# Patient Record
Sex: Male | Born: 1955
Health system: Southern US, Community
[De-identification: ages and names within clinical notes are randomized; demographics above are authoritative.]

## PROBLEM LIST (undated history)

## (undated) DIAGNOSIS — G4733 Obstructive sleep apnea (adult) (pediatric): Secondary | ICD-10-CM

## (undated) DIAGNOSIS — F419 Anxiety disorder, unspecified: Secondary | ICD-10-CM

## (undated) DIAGNOSIS — F32A Depression, unspecified: Secondary | ICD-10-CM

## (undated) DIAGNOSIS — I499 Cardiac arrhythmia, unspecified: Secondary | ICD-10-CM

## (undated) DIAGNOSIS — I10 Essential (primary) hypertension: Secondary | ICD-10-CM

## (undated) HISTORY — DX: Obstructive sleep apnea (adult) (pediatric): G47.33

## (undated) HISTORY — PX: TONSILLECTOMY: SUR1361

## (undated) HISTORY — PX: KNEE CARTILAGE SURGERY: SHX688

## (undated) HISTORY — DX: Anxiety disorder, unspecified: F41.9

## (undated) HISTORY — PX: MOUTH SURGERY: SHX715

## (undated) HISTORY — PX: OTHER SURGICAL HISTORY: SHX169

## (undated) HISTORY — PX: ATRIAL ABLATION SURGERY: SHX560

---

## 2000-01-31 ENCOUNTER — Observation Stay (HOSPITAL_COMMUNITY): Admission: EM | Admit: 2000-01-31 | Discharge: 2000-02-01 | Payer: Self-pay | Admitting: Emergency Medicine

## 2004-09-05 ENCOUNTER — Ambulatory Visit (HOSPITAL_COMMUNITY): Admission: RE | Admit: 2004-09-05 | Discharge: 2004-09-05 | Payer: Self-pay | Admitting: Family Medicine

## 2005-01-18 ENCOUNTER — Emergency Department (HOSPITAL_COMMUNITY): Admission: EM | Admit: 2005-01-18 | Discharge: 2005-01-18 | Payer: Self-pay | Admitting: Emergency Medicine

## 2006-03-23 ENCOUNTER — Ambulatory Visit (HOSPITAL_COMMUNITY): Admission: RE | Admit: 2006-03-23 | Discharge: 2006-03-23 | Payer: Self-pay | Admitting: *Deleted

## 2006-03-23 ENCOUNTER — Encounter (INDEPENDENT_AMBULATORY_CARE_PROVIDER_SITE_OTHER): Payer: Self-pay | Admitting: *Deleted

## 2006-04-13 ENCOUNTER — Ambulatory Visit: Payer: Self-pay | Admitting: Internal Medicine

## 2006-05-18 ENCOUNTER — Inpatient Hospital Stay (HOSPITAL_COMMUNITY): Admission: AD | Admit: 2006-05-18 | Discharge: 2006-05-21 | Payer: Self-pay | Admitting: Cardiology

## 2007-10-15 ENCOUNTER — Ambulatory Visit (HOSPITAL_COMMUNITY): Admission: RE | Admit: 2007-10-15 | Discharge: 2007-10-16 | Payer: Self-pay | Admitting: Orthopedic Surgery

## 2010-11-26 NOTE — Op Note (Signed)
NAMEMATTEO, BANKE NO.:  [id number]   MEDICAL RECORD NO.:  [id number]          PATIENT TYPE:  AMB   LOCATION:  DAY                          FACILITY:  [facility] [place]   PHYSICIAN:  Georges Lynch. Gioffre, M.D.DATE OF BIRTH:  July 22, 1955   DATE OF PROCEDURE:  10/15/2007  DATE OF DISCHARGE:                               OPERATIVE REPORT   SURGEON:  Dr. Darrelyn Hillock.   ASSISTANT:  Jamelle Rushing, P.A.   PREOPERATIVE DIAGNOSES:  1. Complete tear and retraction of the rotator cuff tendon, right      shoulder.  2. Severe impingement syndrome, right shoulder.   POSTOPERATIVE DIAGNOSES:  1. Complete tear and retraction of the rotator cuff tendon, right      shoulder.  2. Severe impingement syndrome, right shoulder.   OPERATION:  1. Open partial acromionectomy and acromioplasty right shoulder.  2. Repair of a complete tear of the central portion of the rotator      cuff tendon, right shoulder, utilizing a Peak anchor with four      sutures.  3. Restore tendon graft, right shoulder.   PROCEDURE:  Under general anesthesia, routine orthopedic prep and  draping of the upper extremity was carried out.  He had 2 grams of IV  Ancef preop.  Note prior to surgery, he had an interscalene nerve block  in the holding area.  When he was taken back to surgery, we did a  sterile prep and draping of the right shoulder and then following that  an incision was made over the anterior aspect of the right shoulder,  bleeders identified and cauterized.  Following that, I separated the  deltoid tendon by sharp dissection from the acromion.  At that time, I  split the very small proximal part of the deltoid muscle.  I excised the  subdeltoid bursa.  Immediately upon going down in the shoulder fluid  came out of the wound site.  This was definitely indicative of a rotator  cuff tear.  We then protected the acromion.  He had a large beak-shaped  acromion which literally was imbedding down into the tendon  and caused a  large hole in the tendon.  We protected the cuff with the Bennett  retractor, utilized the oscillating saw, did a partial acromionectomy  and acromioplasty utilizing the bur.  Following that, we thoroughly  irrigated out the shoulder and then identified a large central tear of  the rotator cuff. We then burred the lateral articular surface of the  humeral head.  We then brought the cuff down in place and sutured it  with a peak anchor in the proximal humerus and four sutures were  attached to the anchor. We brought those out those out through the  tendon and sutured it down in place.  Once this was done, we then  utilized Restore tendon graft.  We had a nice repair.  We also  reestablished  the subacromial space with the acromionectomy. We thoroughly irrigated  out the area and closed the wound in layers in the usual fashion.  The  skin was closed  with metal staples and a sterile Neosporin dressing was  applied.  He was then placed in a shoulder immobilizer.           ______________________________  Georges Lynch Darrelyn Hillock, M.D.     RAG/MEDQ  D:  10/15/2007  T:  10/15/2007  Job:  [id number]

## 2010-11-29 NOTE — H&P (Signed)
NAMEKENLY, XIAO NO.:  [id number]   MEDICAL RECORD NO.:  [id number]          PATIENT TYPE:  INP   LOCATION:  6525                         FACILITY:  [facility]   PHYSICIAN:  Georga Hacking, M.D.DATE OF BIRTH:  Feb 06, 1956   DATE OF ADMISSION:  05/18/2006  DATE OF DISCHARGE:                              HISTORY & PHYSICAL   HISTORY:  The patient is a 55 year old male admitted to initiate  antiarrhythmic therapy for atrial fibrillation.  The patient has a  previous history of paroxysmal atrial fibrillation which was first  diagnosed in 1999.  He has been intermittently on Coumadin throughout  the years.  He has had transient Emergency Room visits and has been  maintained on flecainide and Coumadin.  He for the most part has been  relatively asymptomatic and about a year ago had a normal stress  Cardiolite study because of the history of chest pain.  He develops  significant atrial fibrillation and atrial flutter and underwent a TEE  guided cardioversion in September.  He tolerated this fairly well, and  when he was seen back in the office March 26, 2006, was in sinus  rhythm.  He has continued to have some episodes of atrial fibrillation  since that time and feels as if they have been more frequent.  He was  also seen by Dr. Lewayne Bunting when he sought a second opinion consult  who thought that alternatively, he could stop flecainide and go on a  medicine such as Tikosyn for suppression.  The patient has also seen Dr.  Clydie Braun at [facility] and would be tentatively scheduled  for an ablation in January if he continues to have significant symptoms.  He is admitted at this time for Tikosyn loading.   PAST MEDICAL HISTORY:  Remarkable for a history of asthma, obesity, some  history of depression, and reflux.   PREVIOUS SURGERY:  Tonsillectomy, vasectomy, and nasal septoplasty.   ALLERGIES:  AMOXICILLIN.   CURRENT MEDICATIONS:  1. He is on  Cymbalta 60 mg daily.  2. Atenolol 25 mg daily.  3. Aspirin 325 mg daily.  4. He stopped flecainide on Friday.   SOCIAL HISTORY:  He is married and lives with his wife and children.  He  is a nonsmoker.  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   FAMILY HISTORY:  Father is alive and well with angina and a pacemaker.  Mother is alive and well with a history of breast cancer.   REVIEW OF SYSTEMS:  He has been obese over the years and does complain  of some malaise and fatigue.  He has a history of constipation and some  dyspepsia.  He has occasional dizziness and has some situational stress  as well as some depression.  Other than as noted above, the remainder of  the review of systems is unremarkable.   PHYSICAL EXAMINATION:  GENERAL:  He is a pleasant male who is currently  in no acute distress.  VITAL SIGNS:  His blood pressure was 110/70.  Pulse was 70 and regular.  SKIN:  Warm and dry.  ENT:  EOMI.  PERRLA.  External auditory canals clear.  Fundi not  examined.  Pharynx negative.  NECK:  Supple without masses, JVD, thyromegaly, or bruits.  LUNGS:  Clear to A&P.  CARDIOVASCULAR:  Normal S1, S2.  No S3 or murmur.  ABDOMEN:  Soft and nontender.  Femoral distal pulses 2+.   IMPRESSION:  1. Recurrent paroxysmal atrial fibrillation with recent      transesophageal echocardiogram guided cardioversion.  2. History of situational depression.  3. Obesity.   RECOMMENDATIONS:  The patient had an echocardiogram done recently that  was unremarkable.  He will be admitted to the hospital at this time for  Tikosyn loading.      Georga Hacking, M.D.  Electronically Signed     WST/MEDQ  D:  05/18/2006  T:  05/18/2006  Job:  [id number]   cc:   Dario Guardian, M.D.

## 2010-11-29 NOTE — H&P (Signed)
[name], [name] NO.:  [id number]   MEDICAL RECORD NO.:  [id number]           PATIENT TYPE:   LOCATION:                                 FACILITY:   PHYSICIAN:  Elmore Guise., M.D.DATE OF BIRTH:  [birth year]   DATE OF ADMISSION:  03/23/2006  DATE OF DISCHARGE:                                HISTORY & PHYSICAL   INDICATION FOR ADMISSION:  TEE cardioversion with recurrent atrial  fibrillation.   HISTORY OF PRESENT ILLNESS:  The patient is a very pleasant 55 year old  white male, past medical history of paroxysmal atrial fibrillation, obesity,  who presents for evaluation of palpitations and increasing shortness of  breath.  Patient reports normal state of health and actually was just seen  by Dr. Tresa Endo on last Tuesday over the last five days.  He has been having  increasing palpitations, decreased exertional tolerance, as well as  increasing shortness of breath.  He states I feel like my heart is out of  rhythm.  He denies any over the counter medications, no significant alcohol  intake and no chest pain.  He has had no fever, cough, orthopnea, PND, pre-  syncope or syncope.  He continues to work full time.   REVIEW OF SYSTEMS:  As per HPI.  All others are negative.   CURRENT MEDICATIONS:  1. Tambocor 200 mg twice daily (max dose).'  2. Atenolol 25 daily.  3. Cymbalta 60 mg daily.  4. Aspirin 325 mg daily.  5. Vitamin C, E, B-Complex and multivitamin all once daily.   ALLERGIES:  Amoxicillin.   FAMILY HISTORY:  Positive for heart disease and pacemaker implant with his  father.  His mother had syncope, hypertension and questionable stroke.   SOCIAL HISTORY:  He is married, works as a Tree surgeon, exercises up to  five times per week, no tobacco, drinks socially with one to two glasses of  wine, either daily or every other day.  Also drinks between one and two  caffeinated beverages daily.   PAST SURGICAL HISTORY:  Tonsillectomy and  septoplasty.   PAST HOSPITALIZATION:  Pneumonia three years ago and a-fib with RVR  approximately year ago.   PHYSICAL EXAMINATION:  VITAL SIGNS:  His weight is 232 pounds.  His blood  pressure is 142/80.  His heart rate is 76 and irregular.  GENERAL:  He is a very pleasant white male, alert and oriented x4, no acute  distress.  He has no JVD and no bruits.  LUNGS:  Clear.  HEART:  Irregularly irregular with a normal S1, S2.  ABDOMEN:  Soft, nontender, nondistended.  No rebound or guarding.  EXTREMITIES:  Warm with 2+ pulses and no significant edema.  CARDIAC:  From a cardiac standpoint, he has had a normal echo and Cardiolite  with echo being in 1999 and Cardiolite in 2006.  He has been on Tambocor for  the last 7 years.  His EKG today shows atrial flutter, rate of 100 times per  minute, QT of 378 with poor R-wave progressions, only change from his prior  tracing dated  March 10, 2006, as atrial flutter seems to be new.  His blood  work done today shows a PT-INR of 11.7, 0.87 with a PTT of 31.4.  He had a  CBC showing a white count of 7.8, hemoglobin of 16.6, platelet count of 316,  BUN and creatinine of 15 and 1.2, potassium level 4.4.   IMPRESSION:  1. History of paroxysmal atrial fibrillation, now with recurrence on full      dose Tambocor.  2. No evidence of structural heart disease by past evaluation.   PLAN:  At this time, I have asked him to increase his atenolol to 25 mg  twice daily.  I discussed risks, benefits of DC Cardioversion.  This will be  scheduled for him within the next 24 to 72 hours.  Should his symptoms  resolve, I have asked him to give [place] a call so we can get an EKG to see if  he would spontaneously convert; however, if he does not, since they have  been going on for over a week, we both feel Cardioversion would likely be  beneficial for him.  I also discussed potentially allowing him to see an EP  doctor for possible ablation; however, we both feel that  this would be  suited more if he has recurrence in him, and Dr. Donnie Aho will speak further  on this subject.      Elmore Guise., M.D.  Electronically Signed     TWK/MEDQ  D:  03/19/2006  T:  03/19/2006  Job:  [id number]   cc:   Georga Hacking, M.D.

## 2010-11-29 NOTE — Assessment & Plan Note (Signed)
[facility]                           ELECTROPHYSIOLOGY OFFICE NOTE   [name], [name]                        MRN:          [id number]  DATE:04/13/2006                            DOB:          1956/04/06    ELECTROPHYSIOLOGY CONSULTATION NOTE.   [name] is self-referred today, seeking a second opinion regarding  possible afib ablation.   HISTORY OF PRESENT ILLNESS:  Patient is a very pleasant 55 year old man who  has a history of palpitations and documented afib since 2000.  He has had a  history of obesity and depression and asthma in the past.  He notes that his  atrial arrhythmias increased in frequency and severity really beginning in  the last 6-12 months.  Prior to that, he had been initially placed on  flecainide but over the last several months had been given increasingly  large doses of flecainide, to maintain sinus rhythm, under the direction of  Dr. Viann Fish.  He states that in the last 6 months he has had 2  prolonged episodes which have lasted for over a day.  The most recent one  lasted for over a week before he finally sought medical attention and  underwent transesophageal guided DC cardioversion.  The patient states that  when he is in afib he becomes short of breath, somewhat diaphoretic and will  actually have discomfort in his jaw.  He really does not have much in the  way of chest pain and he has never had syncope.  He states that when he is  out of rhythm he feels okay at rest but when he tries to do anything  strenuous he develops symptoms as previously noted.   MEDICATIONS:  1. Atenolol 25 mg daily.  2. Flecainide 200 mg twice a day.  3. He is on low-dose aspirin.  4. He is not on Coumadin.   HE HAS NO KNOWN DRUG ALLERGIES.   FAMILY HISTORY:  Notable for both parents being alive and otherwise in good  health, though his father does have a pacemaker and some anginal symptoms.   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  He denies tobacco use, he drinks 2 alcoholic beverages per  week.   REVIEW OF SYSTEMS:  Is as noted in the  HPI.  There is a remote history of  asthma.  He has a history of constipation and gastroesophageal reflux.  There is a history of anxiety and depression.  The rest of review of systems  was reviewed and found to be negative.   PHYSICAL EXAMINATION:  He is a pleasant middle-aged man in no acute  distress.  The blood pressure today was 126/78, the pulse was 61 and  regular, the respirations were 18, the weight was 233 pounds.  HEENT EXAM:  Normocephalic and atraumatic.  Pupils equal and round.  The  oropharynx is moist.  Sclera were anicteric.  NECK:  Revealed no jugular venous distention.  There is no thyromegaly.  The  trachea is midline.  The carotids are  2+ and symmetric.  LUNGS:  Clear bilaterally to auscultation.  No wheezes, rales or rhonchi.  There is no increased worker breathing.  CARDIOVASCULAR EXAM:  Revealed a regular rate and rhythm with normal S1 and  S2.  The heart sounds were somewhat distant.  The PMI was not enlarged nor  was it laterally displaced.  I did not appreciate an S3 or an S4 gallop.  ABDOMINAL EXAM:  Soft and nontender.  There was no organomegaly.  The bowel  sounds were present.  There was no rebound or guarding.  EXTREMITIES:  Demonstrated no cyanosis, clubbing or edema.  The pulses were  2+ and symmetric.  NEUROLOGIC EXAM:  Alert and oriented x3 with cranial nerves intact.  The  strength was 5/5 and symmetric.  The EKG demonstrates normal sinus rhythm,  first degree AV block, a QRS duration of 130 milliseconds and left axis  deviation.   IMPRESSION:  1. Paroxysmal atrial fibrillation.  2. Chronic flecainide therapy.  3. Obesity.   DISCUSSION:  I discussed treatment options with [name] in some detail.  These include referral for consideration for afib ablation vs.  continuing on  flecainide vs. trying inpatient admission for Tikosyn initiation.  I have  discussed the benefits and problems with each of these options in some  detail with the patient.  Ultimately, I have recommended for now continuing  on flecainide is a reasonable option.  If his symptoms of fibrillation  increase in frequency or severity then discontinuing flecainide and coming  in for Tikosyn loading would be very reasonable.  Because of the patient's  young age, he may ultimately be a candidate for afib ablation, but at this  point I think a period of watchful waiting would not be unreasonable.  However, he does note that he has had an appointment made with Dr. Clydie Braun, at [facility], and, while he may well not prefer to  proceed with ablation at this point, seeing Dr. Sampson Goon initially for a  consultation would be a very reasonable thing.            ______________________________  Doylene Canning. Ladona Ridgel, MD     GWT/MedQ  DD:  04/13/2006  DT:  04/14/2006  Job #:  [id number]   cc:   Georga Hacking, M.D.  Dario Guardian, M.D.

## 2010-11-29 NOTE — Consult Note (Signed)
[facility]  Patient:    [name], [name]                          MRN: [id number] Adm. Date:  [id number] Disc. Date: [id number] Attending:  Meade Maw A CC:         Dr. Pecola Leisure., M.D.                          Consultation Report  REFERRING PHYSICIAN:  Dr. Chase Picket.  REASON FOR CONSULTATION:  Atrial fibrillation, brought under control.  HISTORY:  The patient is a 55 year old gentleman, otherwise healthy, who presented to the emergency room tonight with concerns of rapid heart rate. The patient has been diagnosed with atrial fibrillation for two years and has been of Tambocor.  He has had one episode of atrial fibrillation which was discovered inadvertently on a routine office visit.  The patient has been vacationing and has had increased alcohol use.  He has subsequent shortness of breath.  He has been treated with Ceftin for approximately two days for a presumed upper respiratory tract problem.  Dr. Lattie Corns saw the patient while in the emergency room and felt the fever to be secondary to viral type syndrome.  The patient has had a nonproductive cough and fevers to 100.5.  He has also had some increased diaphoresis.  REVIEW OF SYSTEMS:  He has had some shortness of breath.  No chest pain. Fever as noted above.  No pedal edema.  Nonproductive cough.  Otherwise, rule out shad been negative.  PAST MEDICAL HISTORY: 1. Hypertension. 2. Atrial fibrillation.  CURRENT MEDICATIONS:  Tambocor.  Atenolol 25 q.d.  DRUG ALLERGIES:  AMOXICILLIN.  SOCIAL HISTORY:  The patient is married.  As noted above, has been recently vacationing with increased alcohol use.  He stopped alcohol approximately two day prior to presentation.  PHYSICAL EXAMINATION:  GENERAL:  Middle-aged male in no acute distress.  HEENT:  Unremarkable.  He is diaphoretic.  PULMONARY:  Breath sounds equal.  Clear to auscultation.  The patient  had previously received Lasix.  VITAL SIGNS:  121/73, heart rate 90-105, regular rhythm; temperature 100.6 in the ER.  ABNORMAL:  Soft, benign and nontender.  No epigastric tenderness.  EXTREMITIES:  No peripheral edema.  NEUROLOGIC:  Nonfocal.  Motor is 5/5 throughout.  SKIN:  Warm.  LABORATORY DATA:  White count 9.0, hematocrit 45.9, platelet count 208, INR 1.0.  Troponin I is 0.05.  CK is 181.  Electrolytes are within normal limits.  IMPRESSION:  Atrial fibrillation.  This may be related to recent alcohol use or to fever.  The patient has had one further episode of breakthrough atrial fibrillation while on Tambocor.  Options were discussed with the patient.  The patient will be discharged to home on an increased dose of atenolol.  Tambocor will be discontinued.  The patient will be restarted on his previous dose of Coumadin at 5 mg.  His INR will be rechecked on Monday.  Following three weeks of anticoagulation, consideration to chemical cardioversion will be performed. Further management will be per Dr. Donnie Aho. DD:  02/01/00 TD:  02/03/00 Job: [id number]

## 2010-11-29 NOTE — Discharge Summary (Signed)
NAMEEZEKIEL, MENZER NO.:  [id number]   MEDICAL RECORD NO.:  [id number]          PATIENT TYPE:  INP   LOCATION:  6525                         FACILITY:  [facility]   PHYSICIAN:  Georga Hacking, M.D.DATE OF BIRTH:  August 01, 1955   DATE OF ADMISSION:  05/18/2006  DATE OF DISCHARGE:  05/21/2006                               DISCHARGE SUMMARY   FINAL DIAGNOSES:  1. Atrial fibrillation, paroxysmal.  2. History of situational stress and depression.   HISTORY OF PRESENT ILLNESS:  A 55 year old male admitted to initiate  antiarrhythmic therapy for atrial fibrillation.  He has previous  paroxysmal atrial fibrillation diagnosed since 1999.  He has been  maintained on flecainide and Coumadin, but noted breakthrough atrial  fibrillation increasing in frequency and severity despite this.  He  underwent at TEE guided cardioversion in September.  Echocardiogram at  that time was unremarkable.  He has since had a consultation with Dr.  Lewayne Bunting as well as Dr. Clydie Braun at [facility] and is  considering having catheter ablation.  Because of the frequency of  breakthrough atrial fibrillation on flecainide, he is being admitted to  change him to Tikosyn to see if this would suppress the atrial  fibrillation while he is considering whether or not he wants to have a  radiofrequency ablation for atrial fibrillation.  Please see the  previously dictated history and physical for remainder of the details.   HOSPITAL COURSE:  His laboratory work was normal with a normal CBC,  except for a glucose of 115, BUN, creatinine were normal.  EKG showed  sinus rhythm.  He was initiated on Tikosyn at a dosage of 500 mcg twice  daily.  He tolerated this well, but continued to have some atrial  fibrillation in the morning.  His QTC was monitored and prolonged only  slightly and was felt within the acceptable parameters.  He was given  the [employer] education package and he is discharged  at this time in  improved condition.  He was in atrial fibrillation the morning of  discharge.   DISCHARGE MEDICATIONS:  1. Tikosyn 500 mcg b.i.d.  2. Atenolol 25 mg b.i.d. with a dose taken at bedtime and in the      morning.  3. Aspirin 325 mg daily.  4. Cymbalta 60 mg daily.   FOLLOW UP:  He is to follow up in 1 week and is to call if there are  problems.      Georga Hacking, M.D.  Electronically Signed     WST/MEDQ  D:  05/21/2006  T:  05/21/2006  Job:  [id number]   cc:   Dario Guardian, M.D.

## 2011-02-12 DIAGNOSIS — S93629A Sprain of tarsometatarsal ligament of unspecified foot, initial encounter: Secondary | ICD-10-CM

## 2011-02-12 DIAGNOSIS — F419 Anxiety disorder, unspecified: Secondary | ICD-10-CM | POA: Insufficient documentation

## 2011-04-08 LAB — DIFFERENTIAL
Basophils Absolute: 0.1
Basophils Relative: 1
Eosinophils Absolute: 0.1
Eosinophils Relative: 2
Lymphocytes Relative: 27
Lymphs Abs: 1.4
Monocytes Absolute: 0.4
Monocytes Relative: 8
Neutro Abs: 3.2
Neutrophils Relative %: 62

## 2011-04-08 LAB — COMPREHENSIVE METABOLIC PANEL
ALT: 21
AST: 22
Albumin: 3.9
Alkaline Phosphatase: 57
BUN: 19
CO2: 29
Calcium: 9
Chloride: 103
Creatinine, Ser: 0.99
GFR calc Af Amer: >60
GFR calc non Af Amer: >60
Glucose, Bld: 100 — ABNORMAL HIGH
Potassium: 3.9
Sodium: 139
Total Bilirubin: 0.7
Total Protein: 6.4

## 2011-04-08 LAB — URINALYSIS, ROUTINE W REFLEX MICROSCOPIC
Glucose, UA: NEGATIVE
Protein, ur: NEGATIVE
Specific Gravity, Urine: 1.023

## 2011-04-08 LAB — CBC
HCT: 43.2
Hemoglobin: 15.1
MCHC: 34.9
MCV: 89
Platelets: 250
RBC: 4.85
RDW: 12.5
WBC: 5.1

## 2011-04-08 LAB — PROTIME-INR
INR: 0.9
Prothrombin Time: 12

## 2011-04-08 LAB — TYPE AND SCREEN: Antibody Screen: NEGATIVE

## 2016-07-16 DIAGNOSIS — L723 Sebaceous cyst: Secondary | ICD-10-CM | POA: Diagnosis not present

## 2016-07-16 DIAGNOSIS — L821 Other seborrheic keratosis: Secondary | ICD-10-CM | POA: Diagnosis not present

## 2016-07-16 DIAGNOSIS — D225 Melanocytic nevi of trunk: Secondary | ICD-10-CM | POA: Diagnosis not present

## 2016-07-16 DIAGNOSIS — L570 Actinic keratosis: Secondary | ICD-10-CM | POA: Diagnosis not present

## 2016-07-18 DIAGNOSIS — M542 Cervicalgia: Secondary | ICD-10-CM | POA: Diagnosis not present

## 2016-07-18 DIAGNOSIS — M25511 Pain in right shoulder: Secondary | ICD-10-CM | POA: Diagnosis not present

## 2016-07-21 DIAGNOSIS — M25511 Pain in right shoulder: Secondary | ICD-10-CM | POA: Diagnosis not present

## 2016-07-21 DIAGNOSIS — M542 Cervicalgia: Secondary | ICD-10-CM | POA: Diagnosis not present

## 2016-07-22 DIAGNOSIS — Z0000 Encounter for general adult medical examination without abnormal findings: Secondary | ICD-10-CM | POA: Diagnosis not present

## 2016-07-22 DIAGNOSIS — I10 Essential (primary) hypertension: Secondary | ICD-10-CM | POA: Diagnosis not present

## 2016-07-22 DIAGNOSIS — Z125 Encounter for screening for malignant neoplasm of prostate: Secondary | ICD-10-CM | POA: Diagnosis not present

## 2016-07-22 DIAGNOSIS — E7800 Pure hypercholesterolemia, unspecified: Secondary | ICD-10-CM | POA: Diagnosis not present

## 2016-07-24 DIAGNOSIS — M542 Cervicalgia: Secondary | ICD-10-CM | POA: Diagnosis not present

## 2016-07-24 DIAGNOSIS — M25511 Pain in right shoulder: Secondary | ICD-10-CM | POA: Diagnosis not present

## 2016-07-29 DIAGNOSIS — M542 Cervicalgia: Secondary | ICD-10-CM | POA: Diagnosis not present

## 2016-07-29 DIAGNOSIS — M25511 Pain in right shoulder: Secondary | ICD-10-CM | POA: Diagnosis not present

## 2016-10-21 DIAGNOSIS — J0140 Acute pansinusitis, unspecified: Secondary | ICD-10-CM | POA: Diagnosis not present

## 2017-01-19 DIAGNOSIS — I10 Essential (primary) hypertension: Secondary | ICD-10-CM | POA: Diagnosis not present

## 2017-03-10 DIAGNOSIS — J0100 Acute maxillary sinusitis, unspecified: Secondary | ICD-10-CM | POA: Diagnosis not present

## 2017-03-23 DIAGNOSIS — Z23 Encounter for immunization: Secondary | ICD-10-CM | POA: Diagnosis not present

## 2017-07-21 DIAGNOSIS — D225 Melanocytic nevi of trunk: Secondary | ICD-10-CM | POA: Diagnosis not present

## 2017-07-21 DIAGNOSIS — L720 Epidermal cyst: Secondary | ICD-10-CM | POA: Diagnosis not present

## 2017-07-21 DIAGNOSIS — L821 Other seborrheic keratosis: Secondary | ICD-10-CM | POA: Diagnosis not present

## 2017-07-21 DIAGNOSIS — L570 Actinic keratosis: Secondary | ICD-10-CM | POA: Diagnosis not present

## 2017-08-11 DIAGNOSIS — I10 Essential (primary) hypertension: Secondary | ICD-10-CM | POA: Diagnosis not present

## 2017-08-11 DIAGNOSIS — E7800 Pure hypercholesterolemia, unspecified: Secondary | ICD-10-CM | POA: Diagnosis not present

## 2017-08-11 DIAGNOSIS — Z125 Encounter for screening for malignant neoplasm of prostate: Secondary | ICD-10-CM | POA: Diagnosis not present

## 2017-08-11 DIAGNOSIS — Z0000 Encounter for general adult medical examination without abnormal findings: Secondary | ICD-10-CM | POA: Diagnosis not present

## 2017-12-02 DIAGNOSIS — J209 Acute bronchitis, unspecified: Secondary | ICD-10-CM | POA: Diagnosis not present

## 2017-12-03 DIAGNOSIS — M5489 Other dorsalgia: Secondary | ICD-10-CM | POA: Diagnosis not present

## 2017-12-03 DIAGNOSIS — N50811 Right testicular pain: Secondary | ICD-10-CM | POA: Diagnosis not present

## 2017-12-03 DIAGNOSIS — R1031 Right lower quadrant pain: Secondary | ICD-10-CM | POA: Diagnosis not present

## 2017-12-04 ENCOUNTER — Other Ambulatory Visit: Payer: Self-pay | Admitting: Family Medicine

## 2017-12-04 ENCOUNTER — Ambulatory Visit
Admission: RE | Admit: 2017-12-04 | Discharge: 2017-12-04 | Disposition: A | Discharge status: Home / Self Care | Payer: 59 | Source: Ambulatory Visit | Attending: Family Medicine | Admitting: Family Medicine

## 2017-12-04 DIAGNOSIS — R1031 Right lower quadrant pain: Secondary | ICD-10-CM

## 2017-12-04 DIAGNOSIS — N132 Hydronephrosis with renal and ureteral calculous obstruction: Secondary | ICD-10-CM | POA: Diagnosis not present

## 2017-12-10 DIAGNOSIS — N201 Calculus of ureter: Secondary | ICD-10-CM | POA: Diagnosis not present

## 2017-12-14 ENCOUNTER — Other Ambulatory Visit: Payer: Self-pay | Admitting: Urology

## 2017-12-15 ENCOUNTER — Encounter (HOSPITAL_COMMUNITY)
Admission: RE | Admit: 2017-12-15 | Discharge: 2017-12-15 | Disposition: A | Discharge status: Home / Self Care | Payer: 59 | Source: Ambulatory Visit | Attending: Urology | Admitting: Urology

## 2017-12-15 ENCOUNTER — Encounter (HOSPITAL_COMMUNITY): Payer: Self-pay | Admitting: *Deleted

## 2017-12-15 DIAGNOSIS — I10 Essential (primary) hypertension: Secondary | ICD-10-CM | POA: Diagnosis not present

## 2017-12-15 DIAGNOSIS — N201 Calculus of ureter: Secondary | ICD-10-CM | POA: Diagnosis present

## 2017-12-17 ENCOUNTER — Ambulatory Visit (HOSPITAL_COMMUNITY)
Admission: RE | Admit: 2017-12-17 | Discharge: 2017-12-17 | Disposition: A | Discharge status: Home / Self Care | Payer: 59 | Source: Ambulatory Visit | Attending: Urology | Admitting: Urology

## 2017-12-17 ENCOUNTER — Ambulatory Visit (HOSPITAL_COMMUNITY): Payer: 59

## 2017-12-17 ENCOUNTER — Other Ambulatory Visit: Payer: Self-pay

## 2017-12-17 ENCOUNTER — Encounter (HOSPITAL_COMMUNITY)
Admission: RE | Disposition: A | Discharge status: Home / Self Care | Payer: Self-pay | Source: Ambulatory Visit | Attending: Urology

## 2017-12-17 ENCOUNTER — Encounter (HOSPITAL_COMMUNITY): Payer: Self-pay | Admitting: *Deleted

## 2017-12-17 DIAGNOSIS — N201 Calculus of ureter: Secondary | ICD-10-CM | POA: Diagnosis not present

## 2017-12-17 DIAGNOSIS — I10 Essential (primary) hypertension: Secondary | ICD-10-CM | POA: Insufficient documentation

## 2017-12-17 HISTORY — PX: EXTRACORPOREAL SHOCK WAVE LITHOTRIPSY: SHX1557

## 2017-12-17 HISTORY — DX: Cardiac arrhythmia, unspecified: I49.9

## 2017-12-17 HISTORY — DX: Essential (primary) hypertension: I10

## 2017-12-17 SURGERY — LITHOTRIPSY, ESWL
Anesthesia: LOCAL | Laterality: Right

## 2017-12-17 MED ORDER — DIAZEPAM 5 MG PO TABS
10.0000 mg | ORAL_TABLET | ORAL | Status: AC
  Administered 2017-12-17: 10 mg via ORAL
  Filled 2017-12-17: qty 2

## 2017-12-17 MED ORDER — DIPHENHYDRAMINE HCL 25 MG PO CAPS
25.0000 mg | ORAL_CAPSULE | ORAL | Status: AC
  Administered 2017-12-17: 25 mg via ORAL
  Filled 2017-12-17: qty 1

## 2017-12-17 MED ORDER — SODIUM CHLORIDE 0.9 % IV SOLN
INTRAVENOUS | Status: DC
  Administered 2017-12-17: 07:00:00 via INTRAVENOUS

## 2017-12-17 MED ORDER — LEVOFLOXACIN 500 MG PO TABS
500.0000 mg | ORAL_TABLET | ORAL | Status: AC
  Administered 2017-12-17: 500 mg via ORAL
  Filled 2017-12-17: qty 1

## 2017-12-17 NOTE — Discharge Instructions (Signed)
Lithotripsy, Care After °This sheet gives you information about how to care for yourself after your procedure. Your health care provider may also give you more specific instructions. If you have problems or questions, contact your health care provider. °What can I expect after the procedure? °After the procedure, it is common to have: °· Some blood in your urine. This should only last for a few days. °· Soreness in your back, sides, or upper abdomen for a few days. °· Blotches or bruises on your back where the pressure wave entered the skin. °· Pain, discomfort, or nausea when pieces (fragments) of the kidney stone move through the tube that carries urine from the kidney to the bladder (ureter). Stone fragments may pass soon after the procedure, but they may continue to pass for up to 4-8 weeks. °? If you have severe pain or nausea, contact your health care provider. This may be caused by a large stone that was not broken up, and this may mean that you need more treatment. °· Some pain or discomfort during urination. °· Some pain or discomfort in the lower abdomen or (in men) at the base of the penis. ° °Follow these instructions at home: °Medicines °· Take over-the-counter and prescription medicines only as told by your health care provider. °· If you were prescribed an antibiotic medicine, take it as told by your health care provider. Do not stop taking the antibiotic even if you start to feel better. °· Do not drive for 24 hours if you were given a medicine to help you relax (sedative). °· Do not drive or use heavy machinery while taking prescription pain medicine. °Eating and drinking °· Drink enough water and fluids to keep your urine clear or pale yellow. This helps any remaining pieces of the stone to pass. It can also help prevent new stones from forming. °· Eat plenty of fresh fruits and vegetables. °· Follow instructions from your health care provider about eating and drinking restrictions. You may be  instructed: °? To reduce how much salt (sodium) you eat or drink. Check ingredients and nutrition facts on packaged foods and beverages. °? To reduce how much meat you eat. °· Eat the recommended amount of calcium for your age and gender. Ask your health care provider how much calcium you should have. °General instructions °· Get plenty of rest. °· Most people can resume normal activities 1-2 days after the procedure. Ask your health care provider what activities are safe for you. °· If directed, strain all urine through the strainer that was provided by your health care provider. °? Keep all fragments for your health care provider to see. Any stones that are found may be sent to a medical lab for examination. The stone may be as small as a grain of salt. °· Keep all follow-up visits as told by your health care provider. This is important. °Contact a health care provider if: °· You have pain that is severe or does not get better with medicine. °· You have nausea that is severe or does not go away. °· You have blood in your urine longer than your health care provider told you to expect. °· You have more blood in your urine. °· You have pain during urination that does not go away. °· You urinate more frequently than usual and this does not go away. °· You develop a rash or any other possible signs of an allergic reaction. °Get help right away if: °· You have severe pain in   your back, sides, or upper abdomen. °· You have severe pain while urinating. °· Your urine is very dark red. °· You have blood in your stool (feces). °· You cannot pass any urine at all. °· You feel a strong urge to urinate after emptying your bladder. °· You have a fever or chills. °· You develop shortness of breath, difficulty breathing, or chest pain. °· You have severe nausea that leads to persistent vomiting. °· You faint. °Summary °· After this procedure, it is common to have some pain, discomfort, or nausea when pieces (fragments) of the  kidney stone move through the tube that carries urine from the kidney to the bladder (ureter). If this pain or nausea is severe, however, you should contact your health care provider. °· Most people can resume normal activities 1-2 days after the procedure. Ask your health care provider what activities are safe for you. °· Drink enough water and fluids to keep your urine clear or pale yellow. This helps any remaining pieces of the stone to pass, and it can help prevent new stones from forming. °· If directed, strain your urine and keep all fragments for your health care provider to see. Fragments or stones may be as small as a grain of salt. °· Get help right away if you have severe pain in your back, sides, or upper abdomen or have severe pain while urinating. °This information is not intended to replace advice given to you by your health care provider. Make sure you discuss any questions you have with your health care provider. °Document Released: 07/20/2007 Document Revised: 05/21/2016 Document Reviewed: 05/21/2016 °Elsevier Interactive Patient Education © 2018 [employer] °Moderate Conscious Sedation, Adult, Care After °These instructions provide you with information about caring for yourself after your procedure. Your health care provider may also give you more specific instructions. Your treatment has been planned according to current medical practices, but problems sometimes occur. Call your health care provider if you have any problems or questions after your procedure. °What can I expect after the procedure? °After your procedure, it is common: °· To feel sleepy for several hours. °· To feel clumsy and have poor balance for several hours. °· To have poor judgment for several hours. °· To vomit if you eat too soon. ° °Follow these instructions at home: °For at least 24 hours after the procedure: ° °· Do not: °? Participate in activities where you could fall or become injured. °? Drive. °? Use heavy  machinery. °? Drink alcohol. °? Take sleeping pills or medicines that cause drowsiness. °? Make important decisions or sign legal documents. °? Take care of children on your own. °· Rest. °Eating and drinking °· Follow the diet recommended by your health care provider. °· If you vomit: °? Drink water, juice, or soup when you can drink without vomiting. °? Make sure you have little or no nausea before eating solid foods. °General instructions °· Have a responsible adult stay with you until you are awake and alert. °· Take over-the-counter and prescription medicines only as told by your health care provider. °· If you smoke, do not smoke without supervision. °· Keep all follow-up visits as told by your health care provider. This is important. °Contact a health care provider if: °· You keep feeling nauseous or you keep vomiting. °· You feel light-headed. °· You develop a rash. °· You have a fever. °Get help right away if: °· You have trouble breathing. °This information is not intended to replace advice   given to you by your health care provider. Make sure you discuss any questions you have with your health care provider. °Document Released: 04/20/2013 Document Revised: 12/03/2015 Document Reviewed: 10/20/2015 °Elsevier Interactive Patient Education © 2018 [employer]vier Inc[employer] ° °

## 2017-12-18 ENCOUNTER — Encounter (HOSPITAL_COMMUNITY): Payer: Self-pay | Admitting: Urology

## 2017-12-21 DIAGNOSIS — I2510 Atherosclerotic heart disease of native coronary artery without angina pectoris: Secondary | ICD-10-CM | POA: Diagnosis not present

## 2017-12-31 DIAGNOSIS — I480 Paroxysmal atrial fibrillation: Secondary | ICD-10-CM | POA: Diagnosis not present

## 2017-12-31 DIAGNOSIS — I2510 Atherosclerotic heart disease of native coronary artery without angina pectoris: Secondary | ICD-10-CM | POA: Diagnosis not present

## 2017-12-31 DIAGNOSIS — I700 Atherosclerosis of aorta: Secondary | ICD-10-CM | POA: Diagnosis not present

## 2018-01-06 DIAGNOSIS — N201 Calculus of ureter: Secondary | ICD-10-CM | POA: Diagnosis not present

## 2018-03-19 DIAGNOSIS — Z23 Encounter for immunization: Secondary | ICD-10-CM | POA: Diagnosis not present

## 2018-03-19 DIAGNOSIS — I10 Essential (primary) hypertension: Secondary | ICD-10-CM | POA: Diagnosis not present

## 2018-03-19 DIAGNOSIS — E7800 Pure hypercholesterolemia, unspecified: Secondary | ICD-10-CM | POA: Diagnosis not present

## 2018-04-15 DIAGNOSIS — L02414 Cutaneous abscess of left upper limb: Secondary | ICD-10-CM | POA: Diagnosis not present

## 2018-07-21 DIAGNOSIS — L821 Other seborrheic keratosis: Secondary | ICD-10-CM | POA: Diagnosis not present

## 2018-07-21 DIAGNOSIS — D225 Melanocytic nevi of trunk: Secondary | ICD-10-CM | POA: Diagnosis not present

## 2018-07-21 DIAGNOSIS — L570 Actinic keratosis: Secondary | ICD-10-CM | POA: Diagnosis not present

## 2018-07-21 DIAGNOSIS — L720 Epidermal cyst: Secondary | ICD-10-CM | POA: Diagnosis not present

## 2018-08-17 DIAGNOSIS — Z0000 Encounter for general adult medical examination without abnormal findings: Secondary | ICD-10-CM | POA: Diagnosis not present

## 2018-08-17 DIAGNOSIS — E7800 Pure hypercholesterolemia, unspecified: Secondary | ICD-10-CM | POA: Diagnosis not present

## 2018-08-17 DIAGNOSIS — Z23 Encounter for immunization: Secondary | ICD-10-CM | POA: Diagnosis not present

## 2019-06-17 IMAGING — CT CT ABD-PELV W/O CM
1 of 2 series · 14 of 32 positions shown, 19 images · non-contrast
Comparison: Plain films 12/03/2017

CLINICAL DATA: Evaluate for kidney stone. Right-sided pain for 2
weeks. Microscopic hematuria.

EXAM:
CT ABDOMEN AND PELVIS WITHOUT CONTRAST
TECHNIQUE: Multidetector CT imaging of the abdomen and pelvis was performed
following the standard protocol without IV contrast.

[Series 2: renal standard/full · axial · 0.91mm/px · z∈[-495,-5]mm · 14 of 112 slices shown, 19 images]
[im 7/112  soft-tissue]
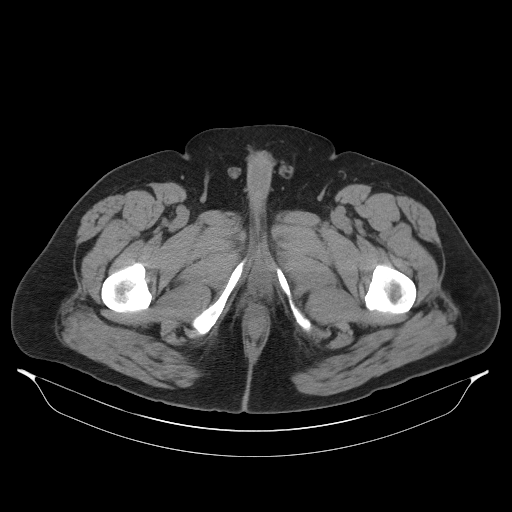
[im 7/112  bone]
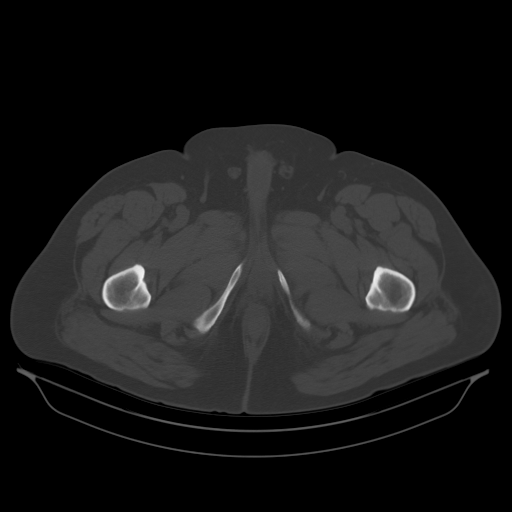
[im 13/112  soft-tissue]
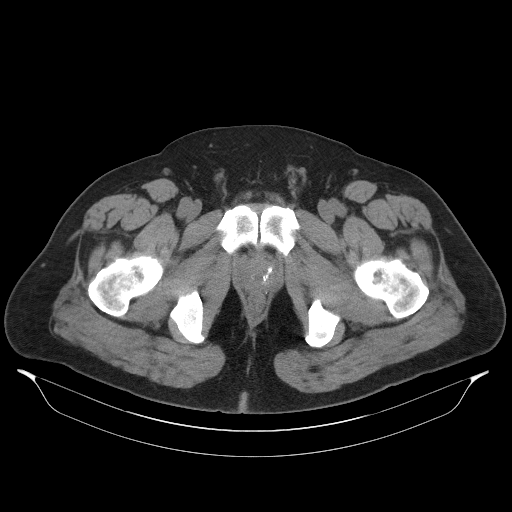
[im 25/112  soft-tissue]
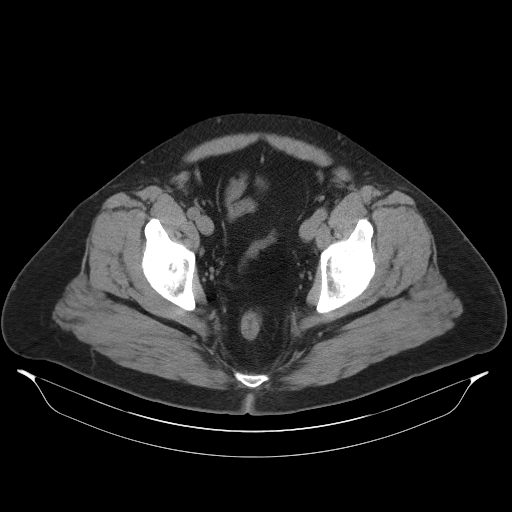
[im 31/112  soft-tissue]
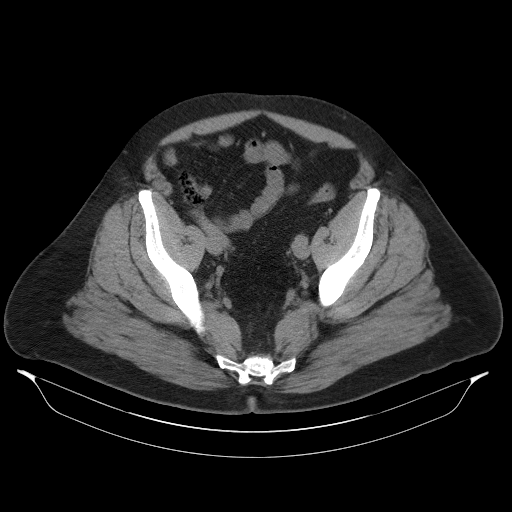
[im 38/112  soft-tissue]
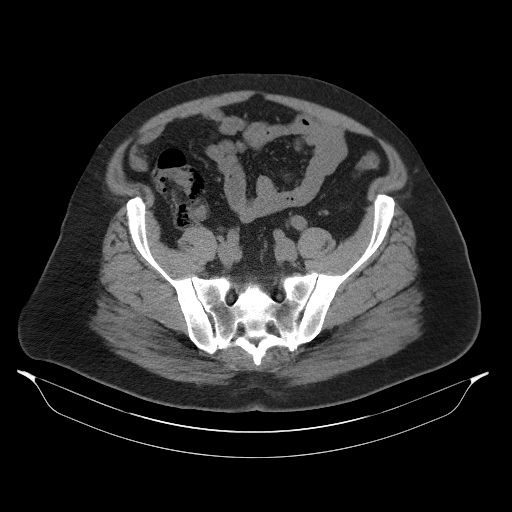
[im 50/112  soft-tissue]
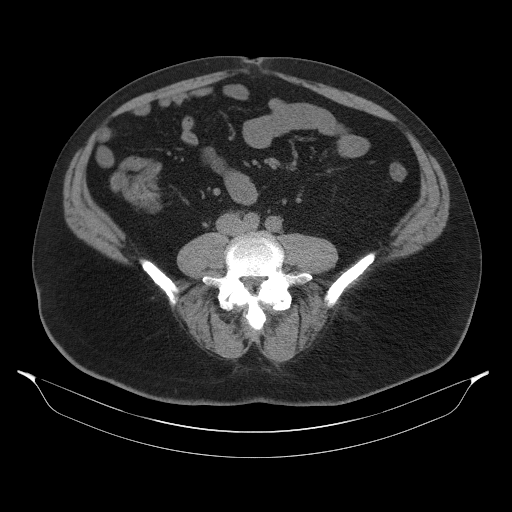
[im 56/112  soft-tissue]
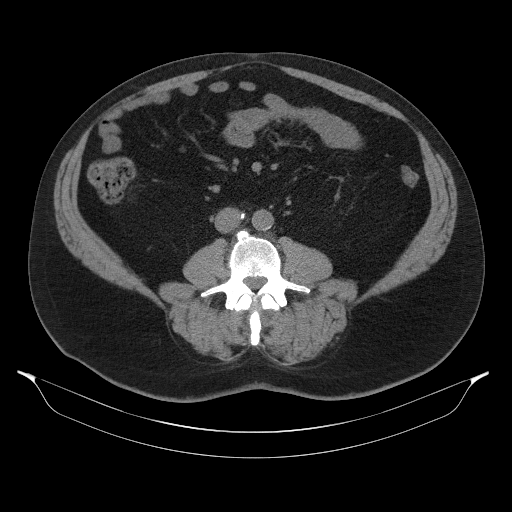
[im 62/112  soft-tissue]
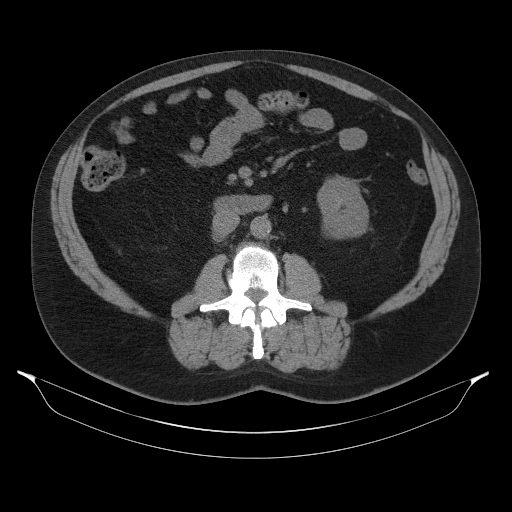
[im 75/112  soft-tissue]
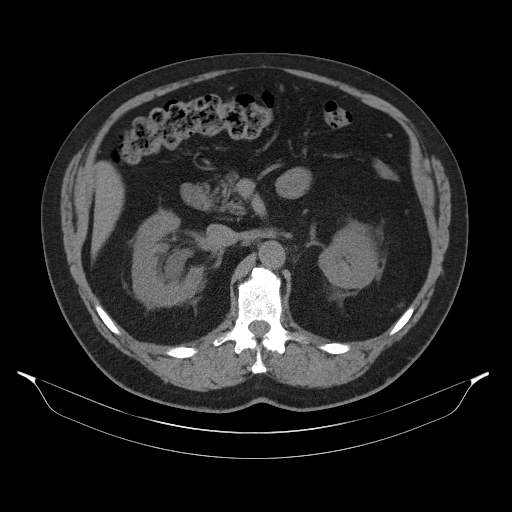
[im 75/112  bone]
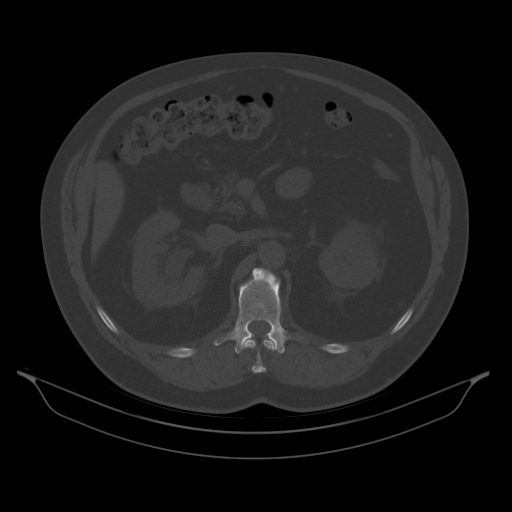
[im 81/112  soft-tissue]
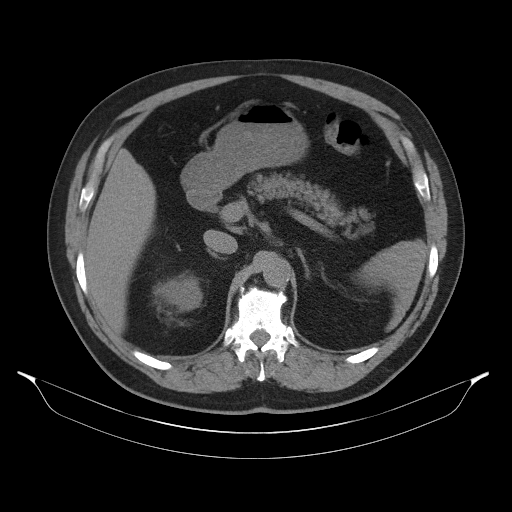
[im 87/112  soft-tissue]
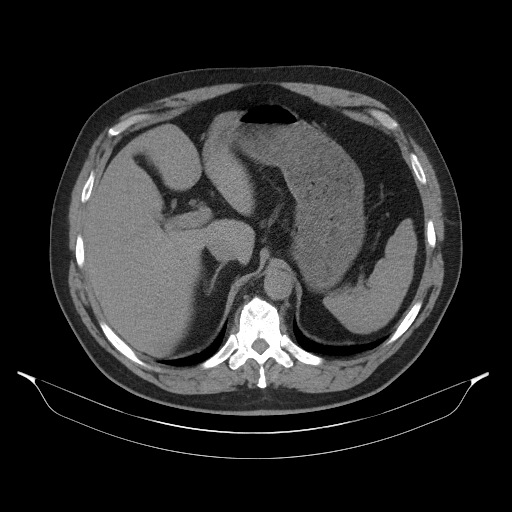
[im 87/112  lung]
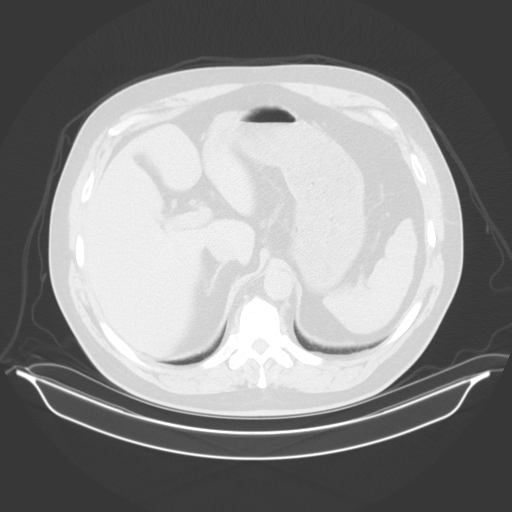
[im 93/112  lung]
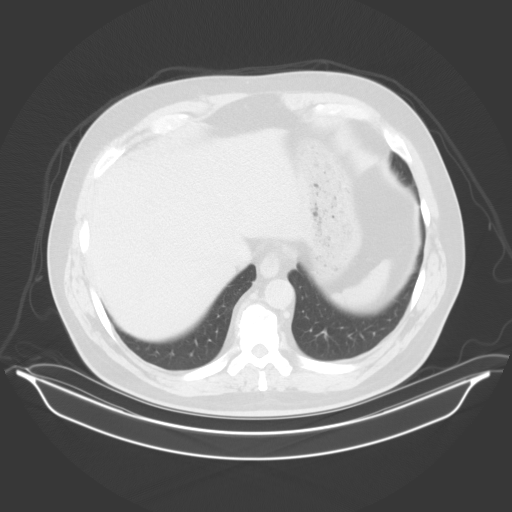
[im 99/112  soft-tissue]
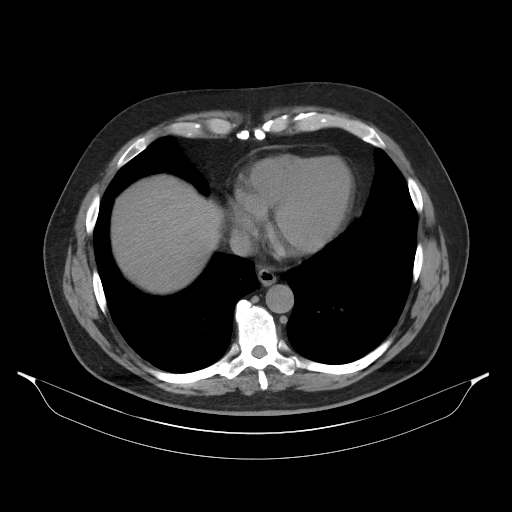
[im 99/112  lung]
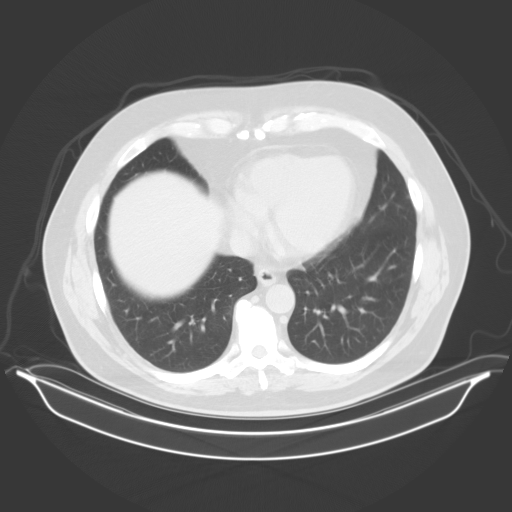
[im 105/112  soft-tissue]
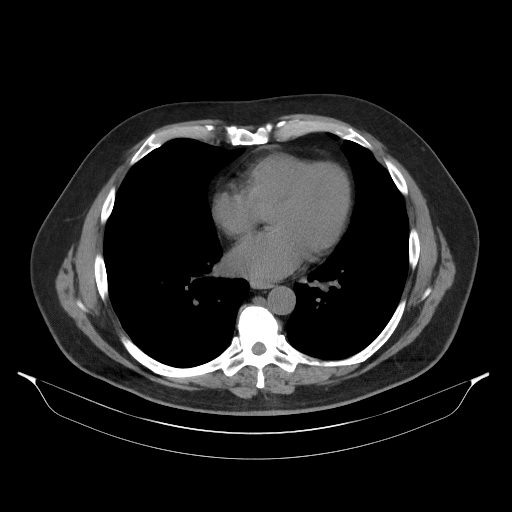
[im 105/112  lung]
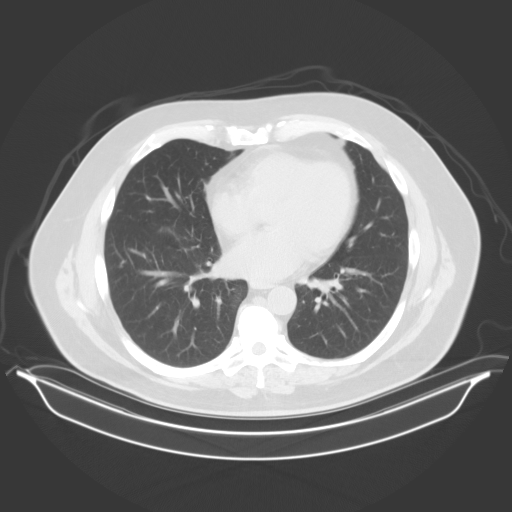

[14 of 32 positions shown; findings below may reference images not displayed]

FINDINGS: Lower chest: Bibasilar pulmonary nodules on the order of 2 mm,
including on images 37/5 and 32/5. Normal heart size without
pericardial or pleural effusion. Dense lad and probable left main
coronary artery atherosclerosis. There is also right coronary artery
atherosclerosis.

Hepatobiliary: Normal liver. Normal gallbladder, without biliary
ductal dilatation.

Pancreas: Normal, without mass or ductal dilatation.

Spleen: Normal in size, without focal abnormality.

Adrenals/Urinary Tract: Normal adrenal glands. Lower pole left renal
1.4 cm low-density lesion is likely a cyst. No renal calculi. Mild
right-sided hydroureteronephrosis. The right ureter is tortuous.
Extends in a retrocaval course. A mid right ureteric stone measures
5-7 mm including on image 56/2 and 57/3. No distal ureteric or
bladder calculi.

Stomach/Bowel: Normal stomach, without wall thickening. Scattered
colonic diverticula. Normal terminal ileum and appendix. Normal
small bowel.

Vascular/Lymphatic: Advanced aortic and branch vessel
atherosclerosis. No abdominopelvic adenopathy.

Reproductive: Normal prostate.

Other: No significant free fluid. Tiny periumbilical fat containing
ventral abdominal wall hernia.

Musculoskeletal: Mild lumbosacral spondylosis.
IMPRESSION: 1. Mid right ureteric 5 x 7 mm calculus with mild secondary
hydroureteronephrosis.
2. Coronary artery atherosclerosis. Aortic Atherosclerosis
([id number]).
3. Tiny bibasilar pulmonary nodules. No follow-up needed if patient
is low-risk. Non-contrast chest CT can be considered in 12 months if
patient is high-risk. This recommendation follows the consensus
statement: Guidelines for Management of Incidental Pulmonary Nodules
Detected on CT Images: From the [HOSPITAL] 9885; Radiology

## 2019-06-30 IMAGING — DX DG ABDOMEN 1V
2 series · 2 of 2 positions shown · non-contrast
Comparison: Abdomen and pelvis CT dated 12/04/2017.

CLINICAL DATA: Right ureteral calculus.  Preop evaluation.

EXAM:
ABDOMEN - 1 VIEW

[abdomen kub (1 of 2)]
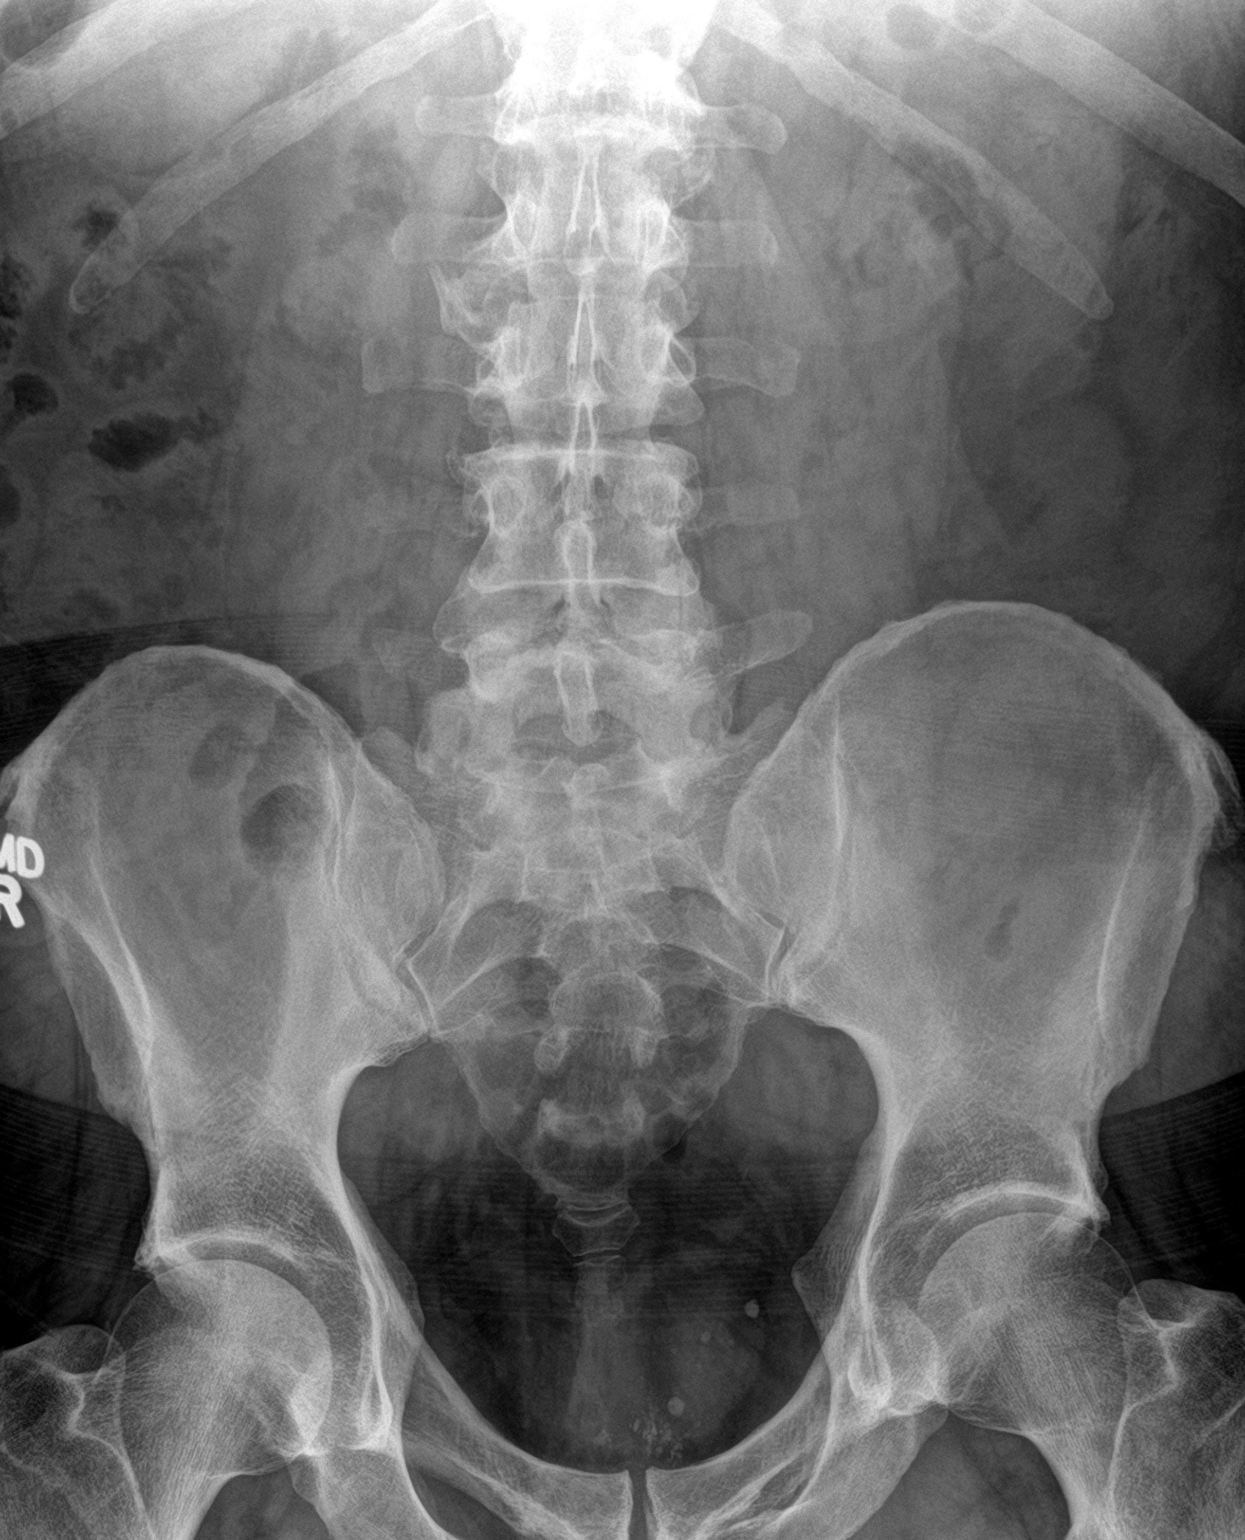

[abdomen kub (2 of 2)]
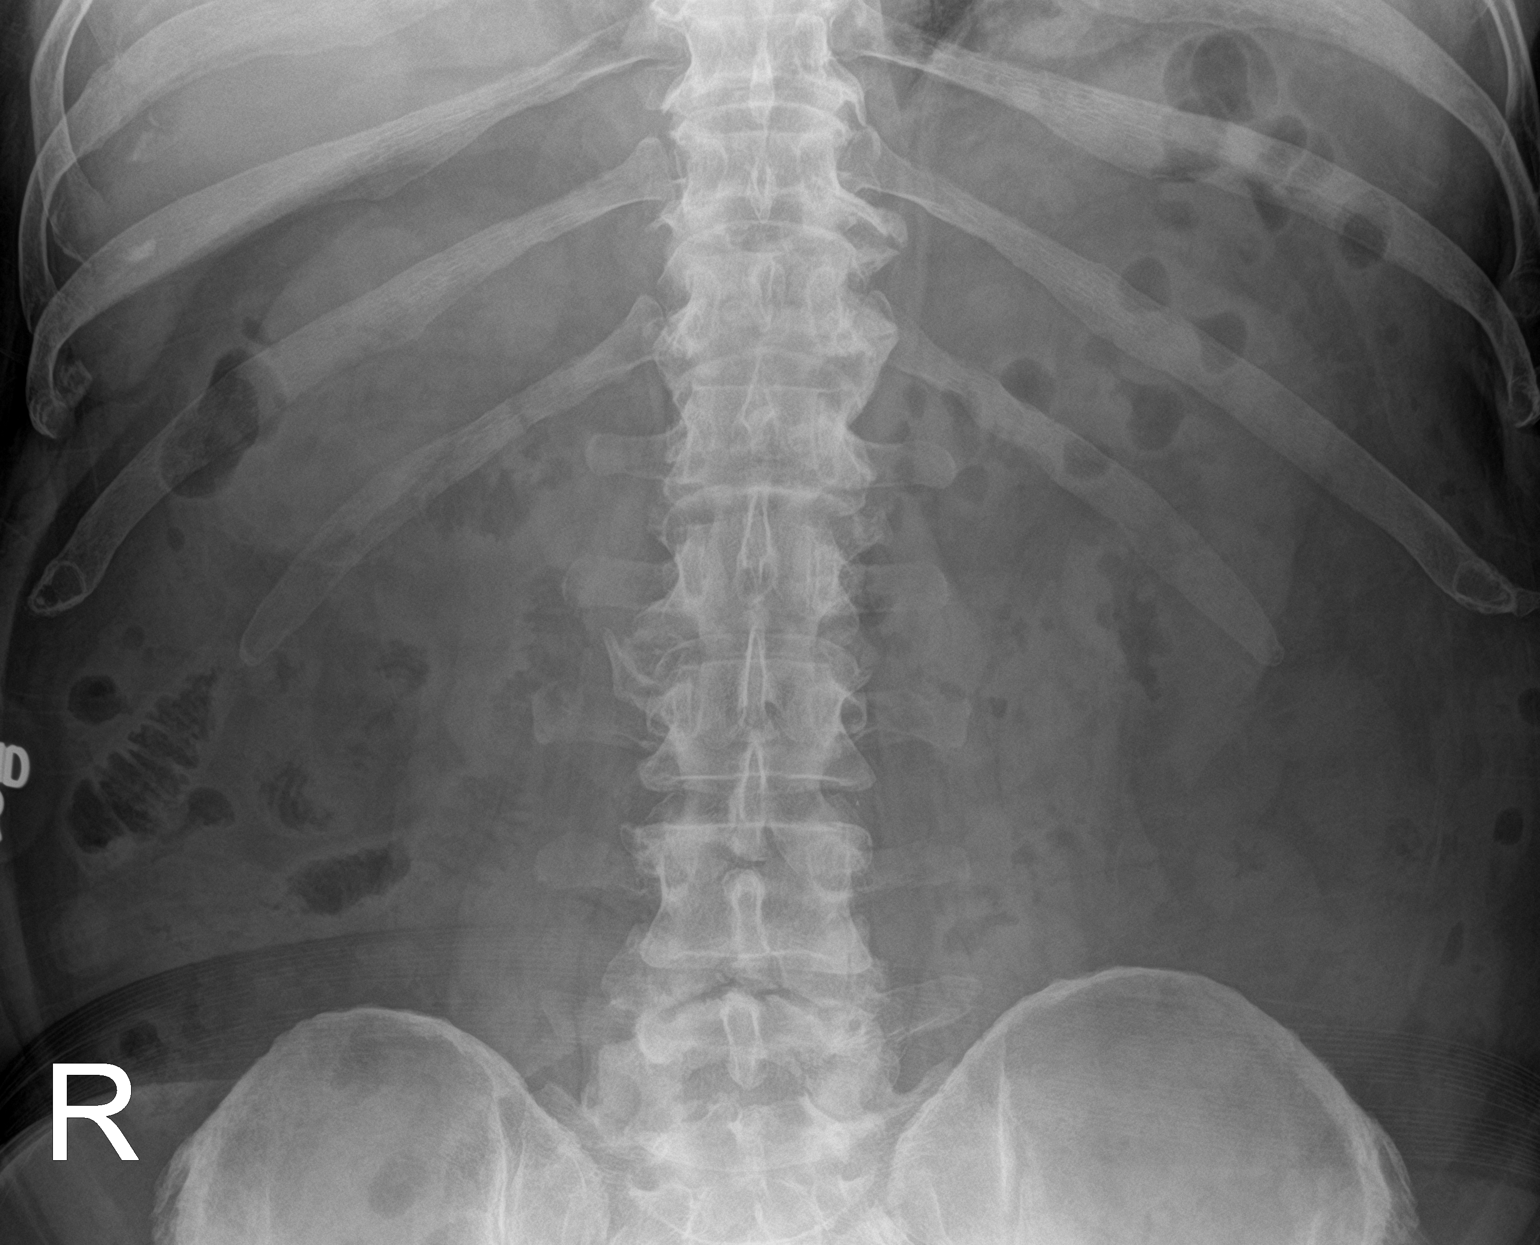

[2 of 2 positions shown; findings below may reference images not displayed]

FINDINGS: Normal bowel gas pattern. No radiopaque urinary tract calculi
visualized. A small rounded calcification overlying the inferior
right sacrum corresponds to a previously demonstrated bone island.
Left pelvic phleboliths and prostatic calcifications are noted.
Lumbar and lower thoracic spine degenerative changes.
IMPRESSION: No radiopaque urinary tract calculi seen.

## 2019-09-16 ENCOUNTER — Ambulatory Visit: Payer: 59 | Attending: Internal Medicine

## 2019-09-16 DIAGNOSIS — Z23 Encounter for immunization: Secondary | ICD-10-CM

## 2019-09-16 NOTE — Progress Notes (Signed)
   Covid-19 Vaccination Clinic  Name:  [name]    MRN: [id number] DOB: [birth year]  09/16/2019  [name] was observed post Covid-19 immunization for 15 minutes without incident. He was provided with Vaccine Information Sheet and instruction to access the V-Safe system.   [name] was instructed to call 911 with any severe reactions post vaccine: Marland Kitchen Difficulty breathing  . Swelling of face and throat  . A fast heartbeat  . A bad rash all over body  . Dizziness and weakness

## 2019-09-24 ENCOUNTER — Other Ambulatory Visit: Payer: Self-pay | Admitting: Otolaryngology

## 2019-09-24 DIAGNOSIS — K219 Gastro-esophageal reflux disease without esophagitis: Secondary | ICD-10-CM

## 2019-10-07 ENCOUNTER — Ambulatory Visit
Admission: RE | Admit: 2019-10-07 | Discharge: 2019-10-07 | Disposition: A | Discharge status: Home / Self Care | Payer: 59 | Source: Ambulatory Visit | Attending: Otolaryngology | Admitting: Otolaryngology

## 2019-10-07 DIAGNOSIS — K219 Gastro-esophageal reflux disease without esophagitis: Secondary | ICD-10-CM

## 2019-10-18 ENCOUNTER — Ambulatory Visit: Payer: 59 | Attending: Internal Medicine

## 2019-10-18 DIAGNOSIS — Z23 Encounter for immunization: Secondary | ICD-10-CM

## 2019-10-18 NOTE — Progress Notes (Signed)
   Covid-19 Vaccination Clinic  Name:  [name]    MRN: [id number] DOB: Jan 15, 1956  10/18/2019  [name] was observed post Covid-19 immunization for 15 minutes without incident. He was provided with Vaccine Information Sheet and instruction to access the V-Safe system.   [name] was instructed to call 911 with any severe reactions post vaccine: Marland Kitchen Difficulty breathing  . Swelling of face and throat  . A fast heartbeat  . A bad rash all over body  . Dizziness and weakness   Immunizations Administered    Name Date Dose VIS Date Route   Pfizer COVID-19 Vaccine 10/18/2019  1:00 PM 0.3 mL 06/24/2019 Intramuscular   Manufacturer: [place]   Lot: [id number]   [place]: [id number]

## 2021-04-19 IMAGING — RF DG ESOPHAGUS
4 series · 14 of 16 positions shown · non-contrast
Comparison: None.

CLINICAL DATA: Gastroesophageal reflux disease.  Coughing.

EXAM:
ESOPHOGRAM / BARIUM SWALLOW / BARIUM TABLET STUDY
TECHNIQUE: Combined double contrast and single contrast examination performed
using effervescent crystals, thick barium liquid, and thin barium
liquid. The patient was observed with fluoroscopy swallowing a 13 mm
barium sulphate tablet.
FLUOROSCOPY TIME:  Fluoroscopy Time:  0 minutes 42 second
Radiation Exposure Index (if provided by the fluoroscopic device):
213 mGy
Number of Acquired Spot Images: 0

[Series 1: sequence · 0.28mm/px · 3 of 29 frames shown (1 of 3)]
[frame 5/29]
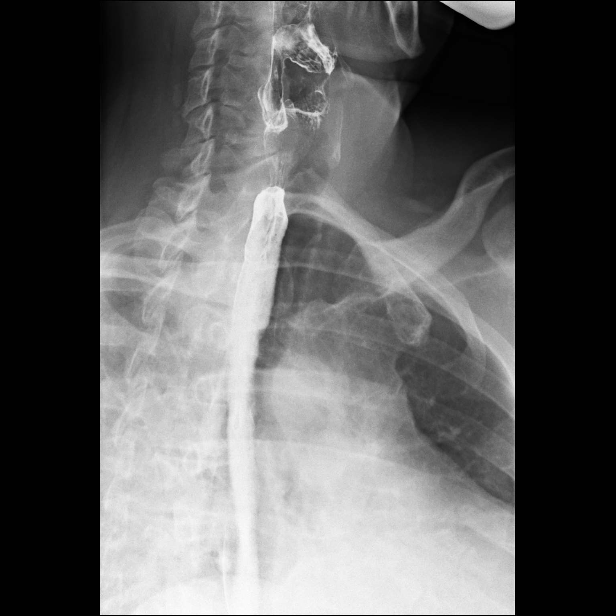
[frame 15/29]
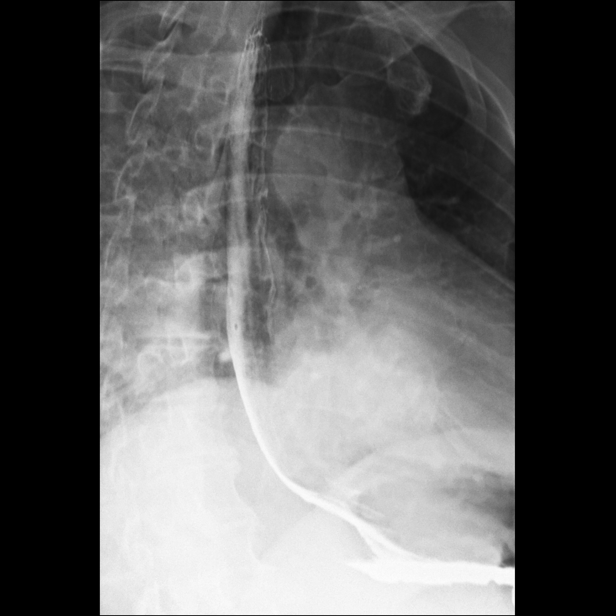
[frame 25/29]
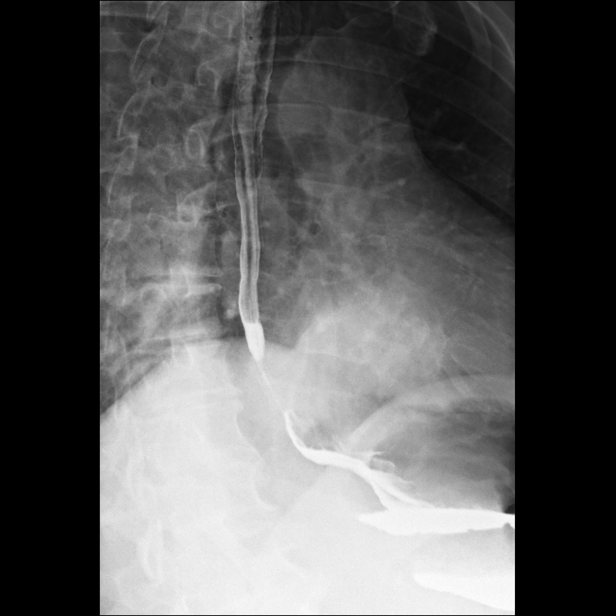

[Series 2: sequence · 0.28mm/px · 4 of 11 frames shown (2 of 3)]
[frame 1/11]
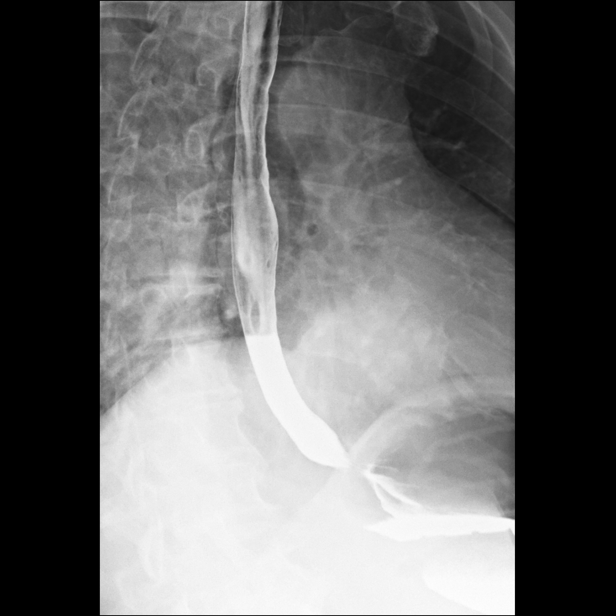
[frame 2/11]
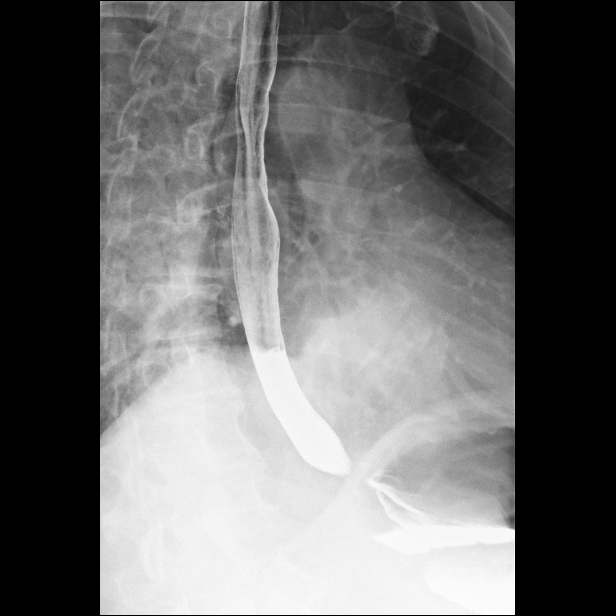
[frame 6/11]
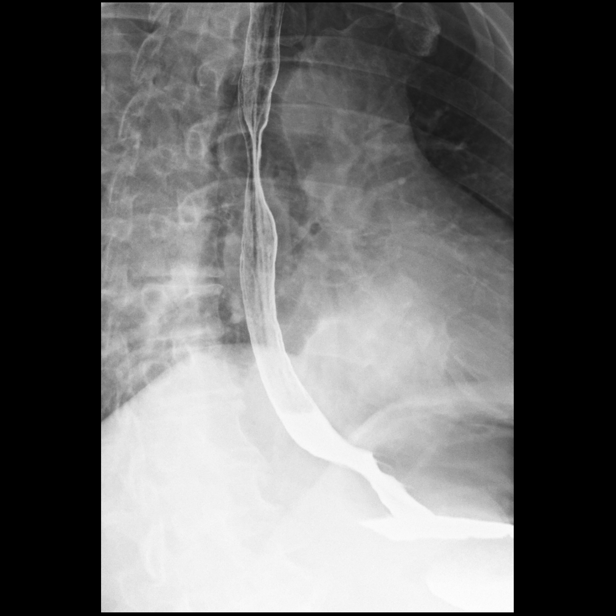
[frame 10/11]
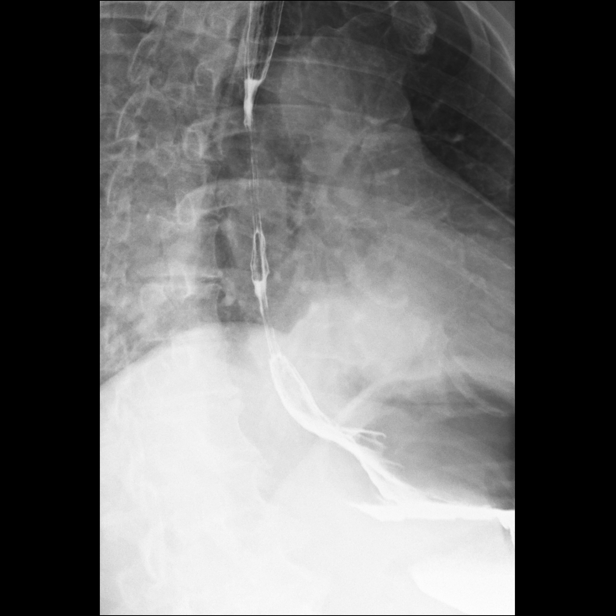

[Series 3: sequence · 0.28mm/px · 3 of 27 frames shown (3 of 3)]
[frame 5/27]
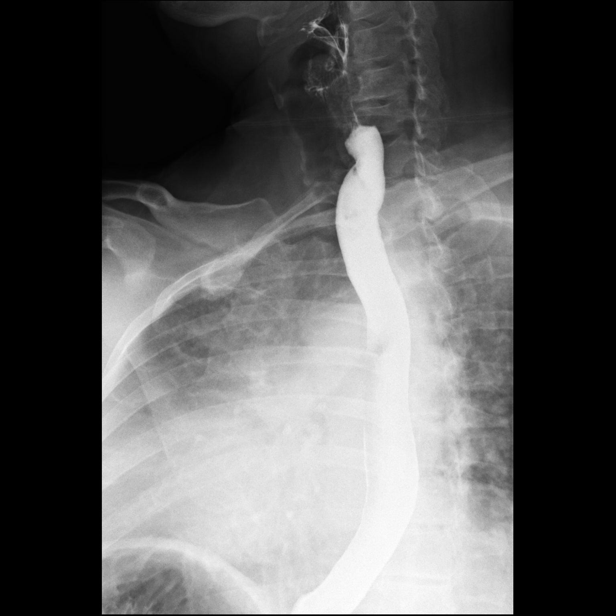
[frame 10/27]
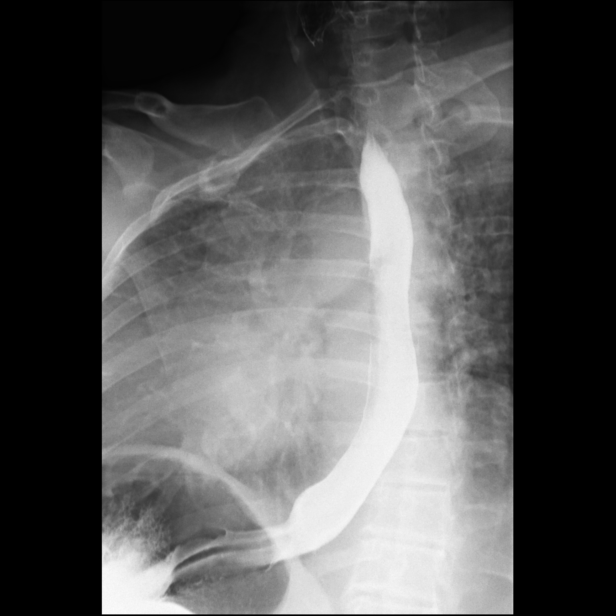
[frame 14/27]
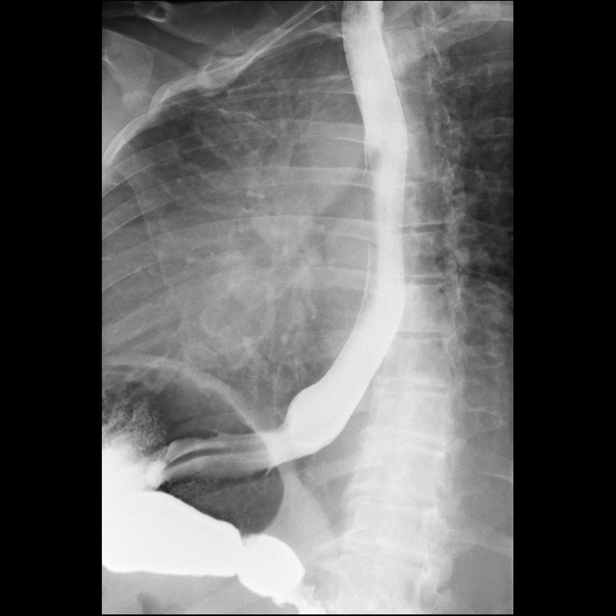

[Series 4: one shot · 4 of 4 slices shown]
[im 1/4]
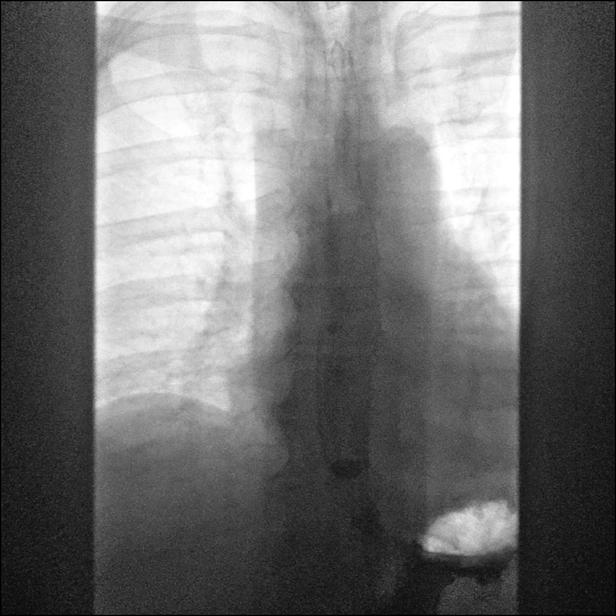
[im 2/4]
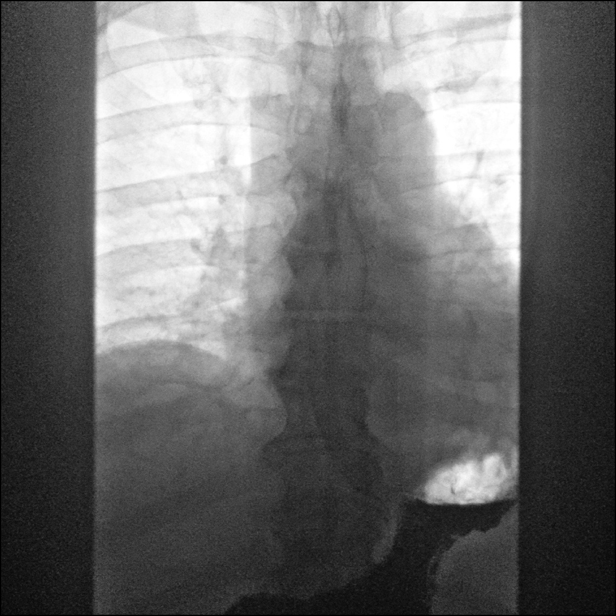
[im 3/4]
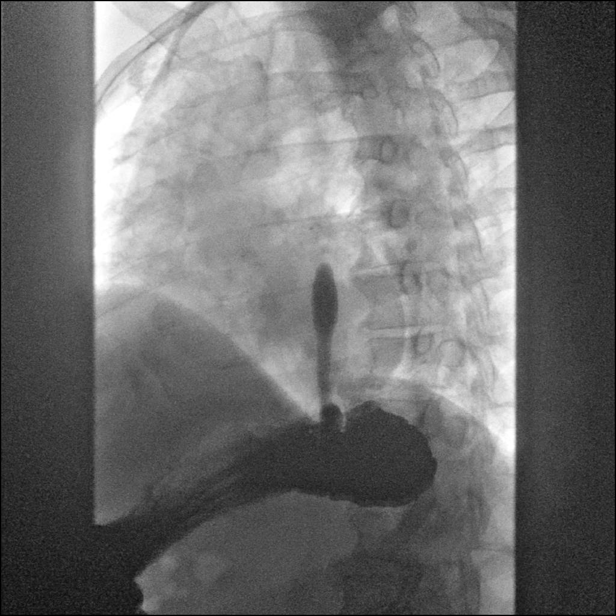
[im 4/4]
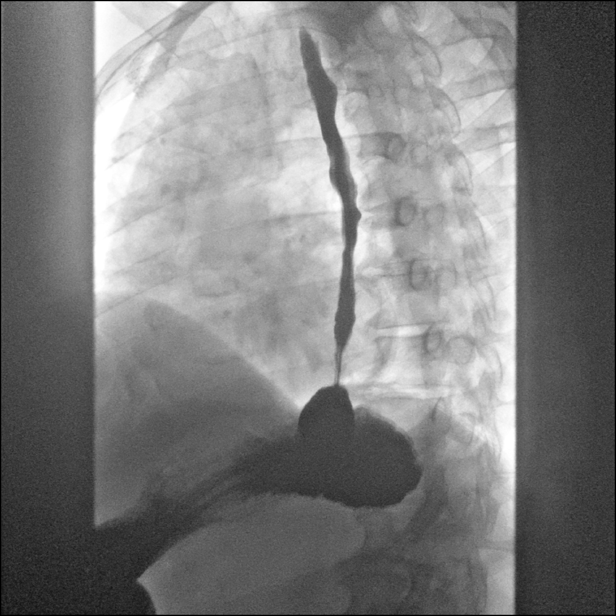

[14 of 16 positions shown; findings below may reference images not displayed]

FINDINGS: Esophageal mucosa and motility normal. Negative for stricture or
mass.

Small hiatal hernia. Moderate to extensive gastroesophageal reflux
with water siphon technique.

Barium tablet passed readily into the stomach.  No stricture.
IMPRESSION: Small hiatal hernia with moderate to extensive gastroesophageal
reflux. Negative for stricture.

## 2022-05-13 ENCOUNTER — Ambulatory Visit (HOSPITAL_COMMUNITY): Payer: 59 | Admitting: Nurse Practitioner

## 2022-05-14 ENCOUNTER — Ambulatory Visit (HOSPITAL_COMMUNITY)
Admission: RE | Admit: 2022-05-14 | Discharge: 2022-05-14 | Disposition: A | Discharge status: Home / Self Care | Payer: 59 | Source: Ambulatory Visit | Attending: Nurse Practitioner | Admitting: Nurse Practitioner

## 2022-05-14 VITALS — BP 170/94 | HR 58 | Ht 73.0 in | Wt 238.6 lb

## 2022-05-14 DIAGNOSIS — Z79899 Other long term (current) drug therapy: Secondary | ICD-10-CM | POA: Insufficient documentation

## 2022-05-14 DIAGNOSIS — Z7901 Long term (current) use of anticoagulants: Secondary | ICD-10-CM | POA: Diagnosis not present

## 2022-05-14 DIAGNOSIS — I10 Essential (primary) hypertension: Secondary | ICD-10-CM | POA: Insufficient documentation

## 2022-05-14 DIAGNOSIS — I4891 Unspecified atrial fibrillation: Secondary | ICD-10-CM | POA: Diagnosis present

## 2022-05-14 DIAGNOSIS — Z8616 Personal history of COVID-19: Secondary | ICD-10-CM | POA: Diagnosis not present

## 2022-05-14 MED ORDER — APIXABAN 5 MG PO TABS: 5.0000 mg | ORAL_TABLET | Freq: Two times a day (BID) | ORAL | 3 refills | Status: DC

## 2022-05-14 NOTE — Progress Notes (Addendum)
Primary Care Physician: Eric Ada, MD Referring Physician: Dr. Cipriano Avery   [name] is a 66 y.o. male with a h/o remote afib having an ablation in 2011 by Dr. Ola Avery in [place] area. He had done well until the first of October when he developed Covid and then he noted return of afib. This was documented on EKG when he saw Dr. Tamala Avery 04/28/22. He was started on eliquis 5 mg bid for a CHA2DS2VASc  score of 1(htn) and metoprolol succinate 25 mg qd. Today he is in SR. He does not feel hs has his usual stamina back yet and still feels winded with exertion.   Today, he denies symptoms of palpitations, chest pain, shortness of breath, orthopnea, PND, lower extremity edema, dizziness, presyncope, syncope, or neurologic sequela. The patient is tolerating medications without difficulties and is otherwise without complaint today.   Past Medical History:  Diagnosis Date   Anxiety    Dysrhythmia     a fib   Hypertension    Past Surgical History:  Procedure Laterality Date   ATRIAL ABLATION SURGERY     EXTRACORPOREAL SHOCK WAVE LITHOTRIPSY Right 12/17/2017   Procedure: RIGHT EXTRACORPOREAL SHOCK WAVE LITHOTRIPSY (ESWL);  Surgeon: Eric Gustin, MD;  Location: [facility];  Service: Urology;  Laterality: Right;   KNEE CARTILAGE SURGERY     MOUTH SURGERY     rotator cuff surgery     TONSILLECTOMY      Current Outpatient Medications  Medication Sig Dispense Refill   apixaban (ELIQUIS) 5 MG TABS tablet      atorvastatin (LIPITOR) 20 MG tablet Take 20 mg by mouth daily.     Coenzyme Q10 (CO Q-10) 200 MG CAPS Take 1 capsule by mouth in the morning.     DULoxetine HCl (CYMBALTA PO) Take 60 mg by mouth.      Ginkgo Biloba 40 MG TABS Take 120 mg by mouth every morning.     lisinopril (PRINIVIL,ZESTRIL) 10 MG tablet Take 10 mg by mouth daily.     metoprolol succinate (TOPROL-XL) 25 MG 24 hr tablet Take 25 mg by mouth daily.     psyllium (METAMUCIL) 58.6 % packet Take 1 packet by mouth  daily.     vitamin C (ASCORBIC ACID) 250 MG tablet Take 500 mg by mouth daily.     No current facility-administered medications for this encounter.    Allergies  Allergen Reactions   Amoxicillin Hives   Penicillins Rash    Amoxicillan. Rash on face after sun exposure wihile on drug    Social History   Socioeconomic History   Marital status: Married    Spouse name: Not on file   Number of children: Not on file   Years of education: Not on file   Highest education level: Not on file  Occupational History   Not on file  Tobacco Use   Smoking status: Never   Smokeless tobacco: Never  Vaping Use   Vaping Use: Never used  Substance and Sexual Activity   Alcohol use: Yes    Alcohol/week: 7.0 standard drinks of alcohol    Types: 7 Standard drinks or equivalent per week   Drug use: Never   Sexual activity: Not on file  Other Topics Concern   Not on file  Social History Narrative   Not on file   Social Determinants of Health   Financial Resource Strain: Not on file  Food Insecurity: Not on file  Transportation Needs: Not on file  Physical Activity: Not on file  Stress: Not on file  Social Connections: Not on file  Intimate Partner Violence: Not on file    No family history on file.  ROS- All systems are reviewed and negative except as per the HPI above  Physical Exam: Vitals:   05/14/22 1307  BP: (!) 170/94  Pulse: (!) 58  Weight: 108.2 kg  Height: '6\' 1"'$  (1.854 m)   Wt Readings from Last 3 Encounters:  05/14/22 108.2 kg  12/17/17 108.9 kg    Labs: Lab Results  Component Value Date   NA 139 10/13/2007   K 3.9 10/13/2007   CL 103 10/13/2007   CO2 29 10/13/2007   GLUCOSE 100 (H) 10/13/2007   BUN 19 10/13/2007   CREATININE 0.99 10/13/2007   CALCIUM 9.0 10/13/2007   Lab Results  Component Value Date   INR 0.9 10/13/2007   No results found for: "CHOL", "HDL", "LDLCALC", "TRIG"   GEN- The patient is well appearing, alert and oriented x 3 today.    Head- normocephalic, atraumatic Eyes-  Sclera clear, conjunctiva pink Ears- hearing intact Oropharynx- clear Neck- supple, no JVP Lymph- no cervical lymphadenopathy Lungs- Clear to ausculation bilaterally, normal work of breathing Heart- Regular rate and rhythm, no murmurs, rubs or gallops, PMI not laterally displaced GI- soft, NT, ND, + BS Extremities- no clubbing, cyanosis, or edema MS- no significant deformity or atrophy Skin- no rash or lesion Psych- euthymic mood, full affect Neuro- strength and sensation are intact  EKG-Sinus brady at 57 bpm, pr int 184 bpm, qrs int 96 ms, qtc 404 ms     Assessment and Plan:  1. Recurrent afib in the setting of Covid Afib has been quiet since ablation in 2011 Feels he is over covid infection at this point Since covid and afib, he has felt more winded  He has been started on metoprolol succinate  25 mg daily Discussed use of Abrams to track at home  We discussed antiarrythmic's vrs repeat ablation and pt would like to pursue ablation. I will ask for EP consult to discuss. The EP  that did original ablation is no longer in the area. Continue metoprolol at current dose.  Triggers discussed    2. CHA2DS2VASc  score of 1 (HTN) He has been started back on eliquis 5 mg bid  For now continue until he can be seen by EP   3. HTN On presentation  170/94 On recheck 120/68 States he usually does not have issues with elevated BP Continue lisinopril  10 mg daily    To EP to discuss ablation  Afib clinic as needed   [name], [facility] [address] [place], [place] [id number] [phone]

## 2022-05-15 ENCOUNTER — Encounter (HOSPITAL_COMMUNITY): Payer: Self-pay | Admitting: Nurse Practitioner

## 2022-06-16 ENCOUNTER — Telehealth: Payer: Self-pay | Admitting: *Deleted

## 2022-06-16 ENCOUNTER — Encounter: Payer: Self-pay | Admitting: Cardiovascular Disease

## 2022-06-16 ENCOUNTER — Ambulatory Visit: Payer: 59 | Attending: Cardiovascular Disease | Admitting: Cardiovascular Disease

## 2022-06-16 VITALS — BP 110/76 | HR 94 | Ht 72.0 in | Wt 235.8 lb

## 2022-06-16 DIAGNOSIS — I4891 Unspecified atrial fibrillation: Secondary | ICD-10-CM

## 2022-06-16 NOTE — Patient Instructions (Addendum)
Medication Instructions:  Your physician recommends that you continue on your current medications as directed. Please refer to the Current Medication list given to you today.  *If you need a refill on your cardiac medications before your next appointment, please call your pharmacy*  Lab Work: BMET and CBC prior to CT scan If you have labs (blood work) drawn today and your tests are completely normal, you will receive your results only by: [place] (if you have MyChart) OR A paper copy in the mail If you have any lab test that is abnormal or we need to change your treatment, we will call you to review the results.  Testing/Procedures: Your physician has requested that you have cardiac CT. Cardiac computed tomography (CT) is a painless test that uses an x-ray machine to take clear, detailed pictures of your heart. For further information please visit [email or web address]. Please follow instruction sheet as given.  Your physician has recommended that you have an ablation. Catheter ablation is a medical procedure used to treat some cardiac arrhythmias (irregular heartbeats). During catheter ablation, a long, thin, flexible tube is put into a blood vessel in your groin (upper thigh), or neck. This tube is called an ablation catheter. It is then guided to your heart through the blood vessel. Radio frequency waves destroy small areas of heart tissue where abnormal heartbeats may cause an arrhythmia to start. Please see the instruction sheet given to you today.  Your physician has recommended that you have a sleep study. This test records several body functions during sleep, including: brain activity, eye movement, oxygen and carbon dioxide blood levels, heart rate and rhythm, breathing rate and rhythm, the flow of air through your mouth and nose, snoring, body muscle movements, and chest and belly movement.  Follow-Up: At [facility], you and your health needs are our priority.  As part  of our continuing mission to provide you with exceptional heart care, we have created designated Provider Care Teams.  These Care Teams include your primary Cardiologist (physician) and Advanced Practice Providers (APPs -  Physician Assistants and Nurse Practitioners) who all work together to provide you with the care you need, when you need it.  Your next appointment:   See instruction letter  Important Information About Sugar

## 2022-06-16 NOTE — Telephone Encounter (Signed)
Pt was seen in the office today with Dr. Myles Gip who ordered an Itamar study. Pt agreeable to signed waiver and not open the box until he has been called with the PIN #.

## 2022-06-16 NOTE — Progress Notes (Signed)
Electrophysiology Office Note:    Date:  06/16/2022   ID:  [name], DOB [birth year], MRN [id number]  PCP:  Carol Ada, [place] Providers Cardiologist:  None Electrophysiologist:  Melida Quitter, MD     Referring MD: Sherran Needs, NP   History of Present Illness:    [name] is a 66 y.o. male with a hx listed below, significant for ablation for atrial fibrillation, referred for arrhythmia management.  He had an ablation for atrial fibrillation in 2011 performed in [facility] by dr. Ola Spurr. He had been doing very well until October. He noticed recurrence of AF symptoms after recovering from [place]. Metoprolol was restarted.  He has fatigue and some palpitations and was acutely aware when he had recurrence of AF.   Past Medical History:  Diagnosis Date   Anxiety    Dysrhythmia     a fib   Hypertension     Past Surgical History:  Procedure Laterality Date   ATRIAL ABLATION SURGERY     EXTRACORPOREAL SHOCK WAVE LITHOTRIPSY Right 12/17/2017   Procedure: RIGHT EXTRACORPOREAL SHOCK WAVE LITHOTRIPSY (ESWL);  Surgeon: Cleon Gustin, MD;  Location: [facility];  Service: Urology;  Laterality: Right;   KNEE CARTILAGE SURGERY     MOUTH SURGERY     rotator cuff surgery     TONSILLECTOMY      Current Medications: Current Meds  Medication Sig   apixaban (ELIQUIS) 5 MG TABS tablet Take 1 tablet (5 mg total) by mouth 2 (two) times daily.   atorvastatin (LIPITOR) 20 MG tablet Take 20 mg by mouth daily.   Coenzyme Q10 (CO Q-10) 200 MG CAPS Take 1 capsule by mouth in the morning.   DULoxetine HCl (CYMBALTA PO) Take 60 mg by mouth.    Ginkgo Biloba 40 MG TABS Take 120 mg by mouth every morning.   lisinopril (PRINIVIL,ZESTRIL) 10 MG tablet Take 10 mg by mouth daily.   metoprolol succinate (TOPROL-XL) 25 MG 24 hr tablet Take 25 mg by mouth daily.   psyllium (METAMUCIL) 58.6 % packet Take 1 packet by mouth daily.   vitamin C (ASCORBIC ACID) 250  MG tablet Take 500 mg by mouth daily.     Allergies:   Amoxicillin and Penicillins   Social History   Socioeconomic History   Marital status: Married    Spouse name: Not on file   Number of children: Not on file   Years of education: Not on file   Highest education level: Not on file  Occupational History   Not on file  Tobacco Use   Smoking status: Never   Smokeless tobacco: Never  Vaping Use   Vaping Use: Never used  Substance and Sexual Activity   Alcohol use: Yes    Alcohol/week: 7.0 standard drinks of alcohol    Types: 7 Standard drinks or equivalent per week   Drug use: Never   Sexual activity: Not on file  Other Topics Concern   Not on file  Social History Narrative   Not on file   Social Determinants of Health   Financial Resource Strain: Not on file  Food Insecurity: Not on file  Transportation Needs: Not on file  Physical Activity: Not on file  Stress: Not on file  Social Connections: Not on file     Family History: The patient's family history is not on file.  ROS:   Please see the history of present illness.    All other  systems reviewed and are negative.  EKGs/Labs/Other Studies Reviewed Today:     ECG from 10/26, reviewed by me, showed AF  EKG:  Last EKG results: today - coarse AF, possibly atypical flutter   Recent Labs: No results found for requested labs within last 365 days.     Physical Exam:    VS:  BP 110/76   Pulse 94   Ht 6' (1.829 m)   Wt 235 lb 12.8 oz (107 kg)   SpO2 98%   BMI 31.98 kg/m     Wt Readings from Last 3 Encounters:  06/16/22 235 lb 12.8 oz (107 kg)  05/14/22 238 lb 9.6 oz (108.2 kg)  12/17/17 240 lb (108.9 kg)     GEN: Well nourished, well developed in no acute distress, obese CARDIAC: Irregular rhythm, no murmurs, rubs, gallops RESPIRATORY:  Normal work of breathing MUSCULOSKELETAL: no edema    ASSESSMENT & PLAN:    Atrial fibrillation: possibly organizing into atypical flutter. Rate  controlled on metoprolol. He is symptoamtic. S/p ablation in 2011. I recommended evaluation of his prior ablation, possible re-ablation. We discussed the indication, rationale, logistics, anticipated benefits, and potential risks of the ablation procedure including but not limited to -- bleed at the groin access site, chest pain, damage to nearby organs such as the diaphragm, lungs, or esophagus, need for a drainage tube, or prolonged hospitalization. I explained that the risk for stroke, heart attack, need for open chest surgery, or even death is very low but not zero. he  expressed understanding and wishes to proceed. Secondary hypercoagulable state. CHADS2Vasc is 1 for hypertension. Will be 2 when he turns 10 next year. Continue eliquis in the per-procedural period. Suspected OSA: will order sleep study. Wife reports heavy snoring and witnessed respiratory arrests.        Medication Adjustments/Labs and Tests Ordered: Current medicines are reviewed at length with the patient today.  Concerns regarding medicines are outlined above.  Orders Placed This Encounter  Procedures   CT CARDIAC MORPH/PULM VEIN W/CM&W/O CA SCORE   CBC with Differential/Platelet   Basic metabolic panel   [place] Sleep Study   No orders of the defined types were placed in this encounter.    Signed, Melida Quitter, MD  06/16/2022 11:38 AM    [place]

## 2022-06-18 NOTE — Addendum Note (Signed)
Addended by: Gwendlyn Deutscher on: 06/18/2022 01:29 PM   Modules accepted: Orders

## 2022-06-20 NOTE — Telephone Encounter (Signed)
Prior Authorization for [facility] sent to [facility] via web portal. Tracking Number . READY TO SCHED--  Prior Authorization is not required --Decision ID #:[id number]

## 2022-06-20 NOTE — Telephone Encounter (Signed)
I will send a message to Gae Bon, sleep coordinator let her know the pt is  calling to see if he has been approved.

## 2022-06-20 NOTE — Telephone Encounter (Signed)
  Pt is calling to f/u when can he start his sleep study

## 2022-06-20 NOTE — Telephone Encounter (Signed)
Called and made the patient aware that he may proceed with the [facility] Sleep Study. PIN # provided to the patient. Patient made aware that he will be contacted after the test has been read with the results and any recommendations. Patient verbalized understanding and thanked me for the call.    Pt has been given PIN# [id number]. Pt states he will do sleep study this weekend.

## 2022-06-23 ENCOUNTER — Encounter (HOSPITAL_BASED_OUTPATIENT_CLINIC_OR_DEPARTMENT_OTHER): Payer: 59 | Admitting: Cardiology

## 2022-06-23 DIAGNOSIS — G4733 Obstructive sleep apnea (adult) (pediatric): Secondary | ICD-10-CM | POA: Diagnosis not present

## 2022-06-24 ENCOUNTER — Encounter: Payer: Self-pay | Admitting: Cardiovascular Disease

## 2022-06-30 ENCOUNTER — Ambulatory Visit: Payer: 59 | Attending: Cardiovascular Disease

## 2022-06-30 DIAGNOSIS — I4891 Unspecified atrial fibrillation: Secondary | ICD-10-CM

## 2022-06-30 NOTE — Procedures (Signed)
SLEEP STUDY REPORT Patient Information Study Date: 06/23/2022 Patient Name: [name] Patient ID: [id number] Birth Date: 04/17/1956 Age: 66 Gender: Male BMI: 32.0 (W=236 lb, H=6' 0'') Stopbang: 7 Referring Physician: Doralee Albino, MD  TEST DESCRIPTION: Home sleep apnea testing was completed using the WatchPat, a Type 1 device, utilizing peripheral arterial tonometry (PAT), chest movement, actigraphy, pulse oximetry, pulse rate, body position and snore. AHI was calculated with apnea and hypopnea using valid sleep time as the denominator. RDI includes apneas, hypopneas, and RERAs. The data acquired and the scoring of sleep and all associated events were performed in accordance with the recommended standards and specifications as outlined in the AASM Manual for the Scoring of Sleep and Associated Events 2.2.0 (2015).   FINDINGS:   1. Mild Obstructive Sleep Apnea with AHI 5.8/hr.   2. No Central Sleep Apnea with pAHIc 1/hr.   3. Oxygen desaturations as low as 85%.   4. Severe snoring was present. O2 sats were < 88% for 0.1 min.   5. Total sleep time was 6 hrs and 14 min.   6. 9.4% of total sleep time was spent in REM sleep.   7. Normal sleep onset latency at 10 min.   8. Prolonged REM sleep onset latency at 246 min.   9. Total awakenings were 27.  10. Arrhythmia detection:  Suggestive of possible brief atrial fibrillation lasting 45 seconds.  This is not diagnostic and further testing with outpatient telemetry monitoring is recommended.  DIAGNOSIS: Minimal Obstructive Sleep Apnea (G47.33) Possible atrial arrhythmias  RECOMMENDATIONS:   1.  Clinical correlation of these findings is necessary.  The decision to treat obstructive sleep apnea (OSA) is usually based on the presence of apnea symptoms or the presence of associated medical conditions such as Hypertension, Congestive Heart Failure, Atrial Fibrillation or Obesity.  The most common symptoms of OSA are snoring, gasping for  breath while sleeping, daytime sleepiness and fatigue.   2.  Initiating apnea therapy is recommended given the presence of symptoms and/or associated conditions. Recommend proceeding with one of the following:     a.  Auto-CPAP therapy with a pressure range of 5-20cm H2O.     b.  An oral appliance (OA) that can be obtained from certain dentists with expertise in sleep medicine.  These are primarily of use in non-obese patients with mild and moderate disease.     c.  An ENT consultation which may be useful to look for specific causes of obstruction and possible treatment options.     d.  If patient is intolerant to PAP therapy, consider referral to ENT for evaluation for hypoglossal nerve stimulator.   3.  Close follow-up is necessary to ensure success with CPAP or oral appliance therapy for maximum benefit.  4.  A follow-up oximetry study on CPAP is recommended to assess the adequacy of therapy and determine the need for supplemental oxygen or the potential need for Bi-level therapy.  An arterial blood gas to determine the adequacy of baseline ventilation and oxygenation should also be considered.  5.  Healthy sleep recommendations include:  adequate nightly sleep (normal 7-9 hrs/night), avoidance of caffeine after noon and alcohol near bedtime, and maintaining a sleep environment that is cool, dark and quiet.  6.  Weight loss for overweight patients is recommended.  Even modest amounts of weight loss can significantly improve the severity of sleep apnea.  7.  Snoring recommendations include:  weight loss where appropriate, side sleeping, and avoidance of alcohol before bed.  8.  Operation of motor vehicle should be avoided when sleepy.  9.  Consider outpatient event monitor to rule out silent atrial fibrillation if clinically indicated.  Signature: Fransico Him, MD; [facility]; [place], [place] Board of Sleep Medicine Electronically Signed: 06/30/2022

## 2022-07-09 ENCOUNTER — Telehealth: Payer: Self-pay | Admitting: *Deleted

## 2022-07-09 NOTE — Telephone Encounter (Signed)
-----   Message from Lauralee Evener, [place] sent at 07/01/2022  8:10 AM EST -----  ----- Message ----- From: Sueanne Margarita, MD Sent: 06/30/2022   4:18 PM EST To: [facility] Sleep Studies  Please let patient know that they have sleep apnea and recommend treating with CPAP.  Please order an auto CPAP from 4-15cm H2O with heated humidity and mask of choice.  Order overnight pulse ox on CPAP.  Followup with me in 6 weeks.

## 2022-07-09 NOTE — Telephone Encounter (Signed)
The patient has been notified of the result and verbalized understanding.  All questions (if any) were answered. Marolyn Hammock, [place] 07/09/2022 3:15 PM    Upon patient request DME selection is [facility]. Patient understands he will be contacted by [place] to set up his cpap. Patient understands to call if [place] does not contact him with new setup in a timely manner. Patient understands they will be called once confirmation has been received from [facility]/ that they have received their new machine to schedule 10 week follow up appointment.   [place] notified of new cpap order  Please add to airview Patient was grateful for the call and thanked me.

## 2022-07-21 ENCOUNTER — Other Ambulatory Visit (HOSPITAL_COMMUNITY): Payer: Self-pay | Admitting: *Deleted

## 2022-07-21 MED ORDER — APIXABAN 5 MG PO TABS: 5.0000 mg | ORAL_TABLET | Freq: Two times a day (BID) | ORAL | 1 refills | Status: DC

## 2022-08-12 ENCOUNTER — Ambulatory Visit: Payer: 59 | Attending: Cardiovascular Disease

## 2022-08-12 DIAGNOSIS — I4891 Unspecified atrial fibrillation: Secondary | ICD-10-CM

## 2022-08-13 LAB — BASIC METABOLIC PANEL
BUN/Creatinine Ratio: 15 (ref 10–24)
BUN: 15 mg/dL (ref 8–27)
CO2: 24 mmol/L (ref 20–29)
Calcium: 9.3 mg/dL (ref 8.6–10.2)
Chloride: 102 mmol/L (ref 96–106)
Creatinine, Ser: 1.01 mg/dL (ref 0.76–1.27)
Glucose: 139 mg/dL — ABNORMAL HIGH (ref 70–99)
Potassium: 4.4 mmol/L (ref 3.5–5.2)
Sodium: 141 mmol/L (ref 134–144)
eGFR: 83 mL/min/{1.73_m2} (ref >59)

## 2022-08-13 LAB — CBC WITH DIFFERENTIAL/PLATELET
Basophils Absolute: 0.1 10*3/uL (ref 0.0–0.2)
Basos: 1 %
EOS (ABSOLUTE): 0.1 10*3/uL (ref 0.0–0.4)
Eos: 2 %
Hematocrit: 51.2 % — ABNORMAL HIGH (ref 37.5–51.0)
Hemoglobin: 17 g/dL (ref 13.0–17.7)
Immature Grans (Abs): 0 10*3/uL (ref 0.0–0.1)
Immature Granulocytes: 0 %
Lymphocytes Absolute: 2 10*3/uL (ref 0.7–3.1)
Lymphs: 28 %
MCH: 31.7 pg (ref 26.6–33.0)
MCHC: 33.2 g/dL (ref 31.5–35.7)
MCV: 96 fL (ref 79–97)
Monocytes Absolute: 0.6 10*3/uL (ref 0.1–0.9)
Monocytes: 9 %
Neutrophils Absolute: 4.3 10*3/uL (ref 1.4–7.0)
Neutrophils: 60 %
Platelets: 304 10*3/uL (ref 150–450)
RBC: 5.36 x10E6/uL (ref 4.14–5.80)
RDW: 12.6 % (ref 11.6–15.4)
WBC: 7.1 10*3/uL (ref 3.4–10.8)

## 2022-08-21 ENCOUNTER — Encounter (HOSPITAL_COMMUNITY): Payer: Self-pay | Admitting: *Deleted

## 2022-08-22 ENCOUNTER — Telehealth (HOSPITAL_COMMUNITY): Payer: Self-pay | Admitting: Emergency Medicine

## 2022-08-22 NOTE — Telephone Encounter (Signed)
Attempted to call patient regarding upcoming cardiac CT appointment. °Left message on voicemail with name and callback number °Kannon Granderson RN Navigator Cardiac Imaging °[facility] °[phone] Office °[phone] Cell ° °

## 2022-08-22 NOTE — Telephone Encounter (Signed)
Reaching out to patient to offer assistance regarding upcoming cardiac imaging study; pt verbalizes understanding of appt date/time, parking situation and where to check in, pre-test NPO status and medications ordered, and verified current allergies; name and call back number provided for further questions should they arise Eric Bond RN Navigator Cardiac Imaging [facility] [phone] office [phone] cell  Arrival 100 WC entrance Denies iv issues Aware contrast  Daily meds

## 2022-08-25 ENCOUNTER — Ambulatory Visit (HOSPITAL_COMMUNITY)
Admission: RE | Admit: 2022-08-25 | Discharge: 2022-08-25 | Disposition: A | Discharge status: Home / Self Care | Payer: 59 | Source: Ambulatory Visit | Attending: Cardiovascular Disease | Admitting: Cardiovascular Disease

## 2022-08-25 DIAGNOSIS — I4891 Unspecified atrial fibrillation: Secondary | ICD-10-CM | POA: Insufficient documentation

## 2022-08-25 MED ORDER — IOHEXOL 350 MG/ML SOLN
95.0000 mL | Freq: Once | INTRAVENOUS | Status: AC | PRN
  Administered 2022-08-25: 95 mL via INTRAVENOUS

## 2022-08-28 NOTE — Pre-Procedure Instructions (Signed)
Spoke with wife [name] regarding procedure instructions.   Arrival time 0515 Nothing to eat or drink after midnight No meds AM of procedure Responsible person to drive you home and stay with you for 24 hrs  Have you missed any doses of anti-coagulant Elqiuis- hasn't missed any doses since CT scan, don't take dose in the morning.

## 2022-08-28 NOTE — Anesthesia Preprocedure Evaluation (Addendum)
Anesthesia Evaluation  Patient identified by MRN, date of birth, ID band Patient awake    Reviewed: Allergy & Precautions, NPO status , Patient's Chart, lab work & pertinent test results, reviewed documented beta blocker date and time   Airway Mallampati: II  TM Distance: >3 FB Neck ROM: Full    Dental  (+) Teeth Intact, Dental Advisory Given   Pulmonary sleep apnea and Continuous Positive Airway Pressure Ventilation    Pulmonary exam normal breath sounds clear to auscultation       Cardiovascular hypertension, Pt. on medications and Pt. on home beta blockers Normal cardiovascular exam+ dysrhythmias Atrial Fibrillation  Rhythm:Regular Rate:Normal     Neuro/Psych  PSYCHIATRIC DISORDERS Anxiety     negative neurological ROS     GI/Hepatic negative GI ROS, Neg liver ROS,,,  Endo/Other  negative endocrine ROS    Renal/GU negative Renal ROS     Musculoskeletal negative musculoskeletal ROS (+)    Abdominal   Peds  Hematology  (+) Blood dyscrasia (Eliquis) Obesity    Anesthesia Other Findings   Reproductive/Obstetrics                             Anesthesia Physical Anesthesia Plan  ASA: 3  Anesthesia Plan: General   Post-op Pain Management: Tylenol PO (pre-op)*   Induction: Intravenous  PONV Risk Score and Plan: 2 and Midazolam, Dexamethasone and Ondansetron  Airway Management Planned: Oral ETT  Additional Equipment:   Intra-op Plan:   Post-operative Plan: Extubation in OR  Informed Consent: I have reviewed the patients History and Physical, chart, labs and discussed the procedure including the risks, benefits and alternatives for the proposed anesthesia with the patient or authorized representative who has indicated his/her understanding and acceptance.     Dental advisory given  Plan Discussed with: CRNA  Anesthesia Plan Comments:        Anesthesia Quick Evaluation

## 2022-08-29 ENCOUNTER — Other Ambulatory Visit (HOSPITAL_COMMUNITY): Payer: Self-pay

## 2022-08-29 ENCOUNTER — Encounter (HOSPITAL_COMMUNITY): Payer: Self-pay | Admitting: Cardiovascular Disease

## 2022-08-29 ENCOUNTER — Encounter (HOSPITAL_COMMUNITY)
Admission: RE | Disposition: A | Discharge status: Home / Self Care | Payer: Self-pay | Source: Home / Self Care | Attending: Cardiovascular Disease

## 2022-08-29 ENCOUNTER — Ambulatory Visit (HOSPITAL_BASED_OUTPATIENT_CLINIC_OR_DEPARTMENT_OTHER): Payer: 59 | Admitting: Anesthesiology

## 2022-08-29 ENCOUNTER — Ambulatory Visit (HOSPITAL_COMMUNITY)
Admission: RE | Admit: 2022-08-29 | Discharge: 2022-08-29 | Disposition: A | Discharge status: Home / Self Care | Payer: 59 | Attending: Cardiovascular Disease | Admitting: Cardiovascular Disease

## 2022-08-29 ENCOUNTER — Ambulatory Visit (HOSPITAL_COMMUNITY): Payer: 59 | Admitting: Anesthesiology

## 2022-08-29 ENCOUNTER — Other Ambulatory Visit: Payer: Self-pay

## 2022-08-29 DIAGNOSIS — Z7901 Long term (current) use of anticoagulants: Secondary | ICD-10-CM | POA: Diagnosis not present

## 2022-08-29 DIAGNOSIS — I4891 Unspecified atrial fibrillation: Secondary | ICD-10-CM

## 2022-08-29 DIAGNOSIS — D6869 Other thrombophilia: Secondary | ICD-10-CM | POA: Insufficient documentation

## 2022-08-29 DIAGNOSIS — Z6832 Body mass index (BMI) 32.0-32.9, adult: Secondary | ICD-10-CM | POA: Diagnosis not present

## 2022-08-29 DIAGNOSIS — G4730 Sleep apnea, unspecified: Secondary | ICD-10-CM | POA: Diagnosis not present

## 2022-08-29 DIAGNOSIS — E669 Obesity, unspecified: Secondary | ICD-10-CM | POA: Diagnosis not present

## 2022-08-29 DIAGNOSIS — Z8616 Personal history of COVID-19: Secondary | ICD-10-CM | POA: Insufficient documentation

## 2022-08-29 DIAGNOSIS — G4733 Obstructive sleep apnea (adult) (pediatric): Secondary | ICD-10-CM

## 2022-08-29 DIAGNOSIS — Z79899 Other long term (current) drug therapy: Secondary | ICD-10-CM | POA: Diagnosis not present

## 2022-08-29 DIAGNOSIS — I10 Essential (primary) hypertension: Secondary | ICD-10-CM | POA: Diagnosis not present

## 2022-08-29 DIAGNOSIS — I480 Paroxysmal atrial fibrillation: Secondary | ICD-10-CM | POA: Insufficient documentation

## 2022-08-29 DIAGNOSIS — Z9989 Dependence on other enabling machines and devices: Secondary | ICD-10-CM

## 2022-08-29 HISTORY — PX: ATRIAL FIBRILLATION ABLATION: EP1191

## 2022-08-29 LAB — POCT ACTIVATED CLOTTING TIME
Activated Clotting Time: 325 seconds
Activated Clotting Time: 347 seconds

## 2022-08-29 SURGERY — ATRIAL FIBRILLATION ABLATION
Anesthesia: General

## 2022-08-29 MED ORDER — FENTANYL CITRATE (PF) 250 MCG/5ML IJ SOLN
INTRAMUSCULAR | Status: DC | PRN
  Administered 2022-08-29 (×2): 50 ug via INTRAVENOUS

## 2022-08-29 MED ORDER — ACETAMINOPHEN 500 MG PO TABS
1000.0000 mg | ORAL_TABLET | Freq: Once | ORAL | Status: AC
  Administered 2022-08-29: 1000 mg via ORAL
  Filled 2022-08-29: qty 2

## 2022-08-29 MED ORDER — SODIUM CHLORIDE 0.9% FLUSH: 3.0000 mL | INTRAVENOUS | Status: DC | PRN

## 2022-08-29 MED ORDER — DEXAMETHASONE SODIUM PHOSPHATE 10 MG/ML IJ SOLN
INTRAMUSCULAR | Status: DC | PRN
  Administered 2022-08-29: 8 mg via INTRAVENOUS

## 2022-08-29 MED ORDER — MIDAZOLAM HCL 2 MG/2ML IJ SOLN
INTRAMUSCULAR | Status: DC | PRN
  Administered 2022-08-29: 2 mg via INTRAVENOUS

## 2022-08-29 MED ORDER — ONDANSETRON HCL 4 MG/2ML IJ SOLN
INTRAMUSCULAR | Status: DC | PRN
  Administered 2022-08-29: 4 mg via INTRAVENOUS

## 2022-08-29 MED ORDER — MIDAZOLAM HCL 2 MG/2ML IJ SOLN
INTRAMUSCULAR | Status: AC
  Filled 2022-08-29: qty 2

## 2022-08-29 MED ORDER — LIDOCAINE 2% (20 MG/ML) 5 ML SYRINGE
INTRAMUSCULAR | Status: DC | PRN
  Administered 2022-08-29: 60 mg via INTRAVENOUS

## 2022-08-29 MED ORDER — HEPARIN (PORCINE) IN NACL 1000-0.9 UT/500ML-% IV SOLN
INTRAVENOUS | Status: DC | PRN
  Administered 2022-08-29 (×4): 500 mL

## 2022-08-29 MED ORDER — HEPARIN SODIUM (PORCINE) 1000 UNIT/ML IJ SOLN
INTRAMUSCULAR | Status: DC | PRN
  Administered 2022-08-29: 18000 [IU] via INTRAVENOUS

## 2022-08-29 MED ORDER — PANTOPRAZOLE SODIUM 40 MG PO TBEC
40.0000 mg | DELAYED_RELEASE_TABLET | Freq: Every day | ORAL | 0 refills | Status: DC
  Filled 2022-08-29: qty 30, 30d supply, fill #0

## 2022-08-29 MED ORDER — PHENYLEPHRINE HCL-NACL 20-0.9 MG/250ML-% IV SOLN
INTRAVENOUS | Status: DC | PRN
  Administered 2022-08-29: 35 ug/min via INTRAVENOUS

## 2022-08-29 MED ORDER — HEPARIN SODIUM (PORCINE) 1000 UNIT/ML IJ SOLN
INTRAMUSCULAR | Status: AC
  Filled 2022-08-29: qty 10

## 2022-08-29 MED ORDER — PROPOFOL 10 MG/ML IV BOLUS
INTRAVENOUS | Status: DC | PRN
  Administered 2022-08-29: 150 mg via INTRAVENOUS

## 2022-08-29 MED ORDER — SUGAMMADEX SODIUM 200 MG/2ML IV SOLN
INTRAVENOUS | Status: DC | PRN
  Administered 2022-08-29: 300 mg via INTRAVENOUS

## 2022-08-29 MED ORDER — DOBUTAMINE INFUSION FOR EP/ECHO/NUC (1000 MCG/ML)
INTRAVENOUS | Status: DC | PRN
  Administered 2022-08-29: 20 ug/kg/min via INTRAVENOUS

## 2022-08-29 MED ORDER — FENTANYL CITRATE (PF) 100 MCG/2ML IJ SOLN
INTRAMUSCULAR | Status: AC
  Filled 2022-08-29: qty 2

## 2022-08-29 MED ORDER — PROTAMINE SULFATE 10 MG/ML IV SOLN
INTRAVENOUS | Status: DC | PRN
  Administered 2022-08-29: 50 mg via INTRAVENOUS

## 2022-08-29 MED ORDER — SODIUM CHLORIDE 0.9 % IV SOLN: 250.0000 mL | INTRAVENOUS | Status: DC | PRN

## 2022-08-29 MED ORDER — HEPARIN SODIUM (PORCINE) 1000 UNIT/ML IJ SOLN
INTRAMUSCULAR | Status: DC | PRN
  Administered 2022-08-29: 1000 [IU] via INTRAVENOUS

## 2022-08-29 MED ORDER — ROCURONIUM BROMIDE 10 MG/ML (PF) SYRINGE
PREFILLED_SYRINGE | INTRAVENOUS | Status: DC | PRN
  Administered 2022-08-29: 60 mg via INTRAVENOUS
  Administered 2022-08-29: 20 mg via INTRAVENOUS

## 2022-08-29 MED ORDER — ONDANSETRON HCL 4 MG/2ML IJ SOLN: 4.0000 mg | Freq: Four times a day (QID) | INTRAMUSCULAR | Status: DC | PRN

## 2022-08-29 MED ORDER — SODIUM CHLORIDE 0.9 % IV SOLN: INTRAVENOUS | Status: DC

## 2022-08-29 MED ORDER — SODIUM CHLORIDE 0.9% FLUSH: 3.0000 mL | Freq: Two times a day (BID) | INTRAVENOUS | Status: DC

## 2022-08-29 MED ORDER — COLCHICINE 0.6 MG PO TABS
0.6000 mg | ORAL_TABLET | Freq: Two times a day (BID) | ORAL | 0 refills | Status: DC
  Filled 2022-08-29: qty 10, 5d supply, fill #0

## 2022-08-29 MED ORDER — ACETAMINOPHEN 325 MG PO TABS: 650.0000 mg | ORAL_TABLET | ORAL | Status: DC | PRN

## 2022-08-29 SURGICAL SUPPLY — 17 items
BLANKET WARM UNDERBOD FULL ACC (MISCELLANEOUS) ×2 IMPLANT
CATH ABLAT QDOT MICRO BI TC DF (CATHETERS) IMPLANT
CATH OCTARAY 2.0 F 3-3-3-3-3 (CATHETERS) IMPLANT
CATH PIGTAIL STEERABLE D1 8.7 (WIRE) IMPLANT
CATH S-M CIRCA TEMP PROBE (CATHETERS) IMPLANT
CATH SOUNDSTAR ECO REPROCESSED (CATHETERS) IMPLANT
CATH WEB BI DIR CSDF CRV REPRO (CATHETERS) IMPLANT
CLOSURE PERCLOSE PROSTYLE (VASCULAR PRODUCTS) IMPLANT
COVER SWIFTLINK CONNECTOR (BAG) ×2 IMPLANT
DEVICE CLOSURE MYNXGRIP 6/7F (Vascular Products) IMPLANT
PACK EP LATEX FREE (CUSTOM PROCEDURE TRAY) ×1
PACK EP LF (CUSTOM PROCEDURE TRAY) ×2 IMPLANT
PAD DEFIB RADIO PHYSIO CONN (PAD) ×2 IMPLANT
PATCH CARTO3 (PAD) IMPLANT
SHEATH CARTO VIZIGO MED CURVE (SHEATH) IMPLANT
SHEATH PINNACLE 8F 10CM (SHEATH) IMPLANT
SHEATH PINNACLE VASC 9FR (SHEATH) IMPLANT

## 2022-08-29 NOTE — Anesthesia Procedure Notes (Signed)
Procedure Name: Intubation Date/Time: 08/29/2022 7:58 AM  Performed by: Inda Coke, CRNAPre-anesthesia Checklist: Patient identified, Emergency Drugs available, Suction available, Timeout performed and Patient being monitored Patient Re-evaluated:Patient Re-evaluated prior to induction Oxygen Delivery Method: Circle system utilized Preoxygenation: Pre-oxygenation with 100% oxygen Induction Type: IV induction Ventilation: Mask ventilation without difficulty and Oral airway inserted - appropriate to patient size Laryngoscope Size: Mac and 4 Grade View: Grade II Tube type: Oral Tube size: 7.5 mm Number of attempts: 1 Airway Equipment and Method: Stylet Placement Confirmation: ETT inserted through vocal cords under direct vision, positive ETCO2, CO2 detector and breath sounds checked- equal and bilateral Secured at: 23 cm Tube secured with: Tape Dental Injury: Teeth and Oropharynx as per pre-operative assessment

## 2022-08-29 NOTE — H&P (Signed)
Electrophysiology Office Note:    Date:  08/29/2022   ID:  [name], DOB Jan 26, 1956, MRN [id number]  PCP:  [name], [place] Providers Cardiologist:  None Electrophysiologist:  Eric Quitter, MD     Referring MD: Eric Quitter, MD   History of Present Illness:    [name] is a 67 y.o. male with a hx listed below, significant for ablation for atrial fibrillation, referred for arrhythmia management.  He had an ablation for atrial fibrillation in 2011 performed in [facility] by [name]. He had been doing very well until October. He noticed recurrence of AF symptoms after recovering from [name]. Metoprolol was restarted.  He has fatigue and some palpitations and was acutely aware when he had recurrence of AF.  I reviewed the patient's CT and labs. There was no LAA thrombus. he  has not missed any doses of anticoagulation, and he took his dose last night. There have been no changes in the patient's diagnoses, medications, or condition since our recent clinic visit.   Past Medical History:  Diagnosis Date   Anxiety    Dysrhythmia     a fib   Hypertension     Past Surgical History:  Procedure Laterality Date   ATRIAL ABLATION SURGERY     EXTRACORPOREAL SHOCK WAVE LITHOTRIPSY Right 12/17/2017   Procedure: RIGHT EXTRACORPOREAL SHOCK WAVE LITHOTRIPSY (ESWL);  Surgeon: Eric Gustin, MD;  Location: [facility];  Service: Urology;  Laterality: Right;   KNEE CARTILAGE SURGERY     MOUTH SURGERY     rotator cuff surgery     TONSILLECTOMY      Current Medications: Current Meds  Medication Sig   apixaban (ELIQUIS) 5 MG TABS tablet Take 1 tablet (5 mg total) by mouth 2 (two) times daily.   ascorbic acid (VITAMIN C) 500 MG tablet Take 500 mg by mouth daily.   atorvastatin (LIPITOR) 20 MG tablet Take 20 mg by mouth daily.   Coenzyme Q10 (CO Q-10) 200 MG CAPS Take 1 capsule by mouth in the morning.   desonide (DESOWEN) 0.05 % ointment  Apply 1 Application topically 2 (two) times daily as needed (rash).   DULoxetine (CYMBALTA) 60 MG capsule Take 60 mg by mouth daily.   Ginkgo Biloba 120 MG CAPS Take 120 mg by mouth daily.   lisinopril (PRINIVIL,ZESTRIL) 10 MG tablet Take 10 mg by mouth daily.   Melatonin 10 MG TABS Take 20 mg by mouth at bedtime as needed (sleep).   METAMUCIL FIBER PO Take 1 Dose by mouth daily. 1 dose = 1 teaspoon   metoprolol succinate (TOPROL-XL) 25 MG 24 hr tablet Take 25 mg by mouth daily.     Allergies:   Amoxicillin, Cefuroxime, and Penicillins   Social History   Socioeconomic History   Marital status: Married    Spouse name: Not on file   Number of children: Not on file   Years of education: Not on file   Highest education level: Not on file  Occupational History   Not on file  Tobacco Use   Smoking status: Never   Smokeless tobacco: Never  Vaping Use   Vaping Use: Never used  Substance and Sexual Activity   Alcohol use: Yes    Alcohol/week: 7.0 standard drinks of alcohol    Types: 7 Standard drinks or equivalent per week   Drug use: Never   Sexual activity: Not on file  Other Topics Concern   Not  on file  Social History Narrative   Not on file   Social Determinants of Health   Financial Resource Strain: Not on file  Food Insecurity: Not on file  Transportation Needs: Not on file  Physical Activity: Not on file  Stress: Not on file  Social Connections: Not on file     Family History: The patient's family history is not on file.  ROS:   Please see the history of present illness.    All other systems reviewed and are negative.  EKGs/Labs/Other Studies Reviewed Today:     ECG from 10/26, reviewed by me, showed AF  EKG:  Last EKG results: today - coarse AF, possibly atypical flutter   Recent Labs: 08/12/2022: BUN 15; Creatinine, Ser 1.01; Hemoglobin 17.0; Platelets 304; Potassium 4.4; Sodium 141     Physical Exam:    VS:  BP 130/86   Pulse 87   Temp 97.6 F  (36.4 C)   Resp 16   Ht 6' (1.829 m)   Wt 108.9 kg   SpO2 93%   BMI 32.55 kg/m     Wt Readings from Last 3 Encounters:  08/29/22 108.9 kg  06/16/22 107 kg  05/14/22 108.2 kg     GEN: Well nourished, well developed in no acute distress, obese CARDIAC: Irregular rhythm, no murmurs, rubs, gallops RESPIRATORY:  Normal work of breathing MUSCULOSKELETAL: no edema    ASSESSMENT & PLAN:    Atrial fibrillation: possibly organizing into atypical flutter. Rate controlled on metoprolol. He is symptomatic. S/p ablation in 2011. He is here for study of his prior ablation, possible re-ablation. He is ready to proceed. Secondary hypercoagulable state. CHADS2Vasc is 1 for hypertension. Will be 2 when he turns 82 next year. Continue eliquis in the per-procedural period. Suspected OSA: will order sleep study. Wife reports heavy snoring and witnessed respiratory arrests.        Medication Adjustments/Labs and Tests Ordered: Current medicines are reviewed at length with the patient today.  Concerns regarding medicines are outlined above.  Orders Placed This Encounter  Procedures   Informed Consent Details: Physician/Practitioner Attestation; Transcribe to consent form and obtain patient signature   Initiate Pre-op Protocol   [place] on call to EP Lab   Confirm CBC and BMP (or CMP) results within 7 days for inpatient and 30 days for outpatient:   Clip right and left femoral area PM before surgery   Clip right internal jugular area PM before surgery   Pre-admission testing diagnosis   EP STUDY   Insert peripheral IV   Meds ordered this encounter  Medications   0.9 %  sodium chloride infusion   acetaminophen (TYLENOL) tablet 1,000 mg   midazolam (VERSED) 2 MG/2ML injection    [name] L: cabinet override   fentaNYL (SUBLIMAZE) 100 MCG/2ML injection    [name]: cabinet override     Signed, Eric Quitter, MD  08/29/2022 7:32 AM    [place]

## 2022-08-29 NOTE — Anesthesia Postprocedure Evaluation (Signed)
Anesthesia Post Note  Patient: [name]  Procedure(s) Performed: ATRIAL FIBRILLATION ABLATION     Patient location during evaluation: PACU Anesthesia Type: General Level of consciousness: awake and alert Pain management: pain level controlled Vital Signs Assessment: post-procedure vital signs reviewed and stable Respiratory status: spontaneous breathing, nonlabored ventilation and respiratory function stable Cardiovascular status: blood pressure returned to baseline and stable Postop Assessment: no apparent nausea or vomiting Anesthetic complications: no   No notable events documented.  Last Vitals:  Vitals:   08/29/22 1300 08/29/22 1334  BP: 115/76 123/82  Pulse: 76 68  Resp: 15 15  Temp:    SpO2: 93% 93%    Last Pain:  Vitals:   08/29/22 1035  TempSrc:   PainSc: 0-No pain                 Santa Lighter

## 2022-08-29 NOTE — Transfer of Care (Signed)
Immediate Anesthesia Transfer of Care Note  Patient: [name]  Procedure(s) Performed: ATRIAL FIBRILLATION ABLATION  Patient Location: PACU and Cath Lab  Anesthesia Type:General  Level of Consciousness: awake and alert   Airway & Oxygen Therapy: Patient Spontanous Breathing and Patient connected to nasal cannula oxygen  Post-op Assessment: Report given to RN and Post -op Vital signs reviewed and stable  Post vital signs: Reviewed and stable  Last Vitals:  Vitals Value Taken Time  BP    Temp    Pulse    Resp    SpO2      Last Pain:  Vitals:   08/29/22 0540  PainSc: 0-No pain      Patients Stated Pain Goal: 4 ([phone] [id number])  Complications: No notable events documented.

## 2022-08-29 NOTE — Discharge Instructions (Signed)

## 2022-09-09 ENCOUNTER — Encounter: Payer: Self-pay | Admitting: Cardiovascular Disease

## 2022-09-15 NOTE — Telephone Encounter (Signed)
Late entry--Spoke with patient on 09/12/22, he states his PCP will send Rx for metoprolol. Patient states he will call if any questions or issues.

## 2022-09-26 ENCOUNTER — Encounter (HOSPITAL_COMMUNITY): Payer: Self-pay | Admitting: Physician Assistant

## 2022-09-26 ENCOUNTER — Ambulatory Visit (HOSPITAL_COMMUNITY)
Admission: RE | Admit: 2022-09-26 | Discharge: 2022-09-26 | Disposition: A | Discharge status: Home / Self Care | Payer: 59 | Source: Ambulatory Visit | Attending: Physician Assistant | Admitting: Physician Assistant

## 2022-09-26 VITALS — BP 144/90 | HR 63 | Ht 72.0 in | Wt 245.4 lb

## 2022-09-26 DIAGNOSIS — I480 Paroxysmal atrial fibrillation: Secondary | ICD-10-CM | POA: Insufficient documentation

## 2022-09-26 DIAGNOSIS — Z8616 Personal history of COVID-19: Secondary | ICD-10-CM | POA: Insufficient documentation

## 2022-09-26 DIAGNOSIS — G4733 Obstructive sleep apnea (adult) (pediatric): Secondary | ICD-10-CM | POA: Insufficient documentation

## 2022-09-26 DIAGNOSIS — I10 Essential (primary) hypertension: Secondary | ICD-10-CM | POA: Insufficient documentation

## 2022-09-26 DIAGNOSIS — Z7901 Long term (current) use of anticoagulants: Secondary | ICD-10-CM | POA: Diagnosis not present

## 2022-09-26 NOTE — Progress Notes (Signed)
Primary Care Physician: Carol Ada, MD Referring Physician: Dr. Cipriano Mile Primary EP: Dr Myles Gip   [name] is a 67 y.o. male with a h/o remote afib having an ablation in 2011 by Dr. Ola Spurr in [facility] area. He had done well until the first of October when he developed Covid and then he noted return of afib. This was documented on EKG when he saw Dr. Tamala Julian 04/28/22. He was started on eliquis 5 mg bid for a CHA2DS2VASc  score of 1(htn) and metoprolol succinate 25 mg qd.   On follow up today, patient reports that he has had 6-8 episodes of afib since his ablation, the longest lasting about one hour. He denies chest pain, swallowing pain, or groin issues. He is using his new CPAP.  Today, he denies symptoms of chest pain, shortness of breath, orthopnea, PND, lower extremity edema, dizziness, presyncope, syncope, or neurologic sequela. The patient is tolerating medications without difficulties and is otherwise without complaint today.   Past Medical History:  Diagnosis Date   Anxiety    Dysrhythmia     a fib   Hypertension    Past Surgical History:  Procedure Laterality Date   ATRIAL ABLATION SURGERY     ATRIAL FIBRILLATION ABLATION N/A 08/29/2022   Procedure: ATRIAL FIBRILLATION ABLATION;  Surgeon: Melida Quitter, MD;  Location: [place] [facility];  Service: Cardiovascular;  Laterality: N/A;   EXTRACORPOREAL SHOCK WAVE LITHOTRIPSY Right 12/17/2017   Procedure: RIGHT EXTRACORPOREAL SHOCK WAVE LITHOTRIPSY (ESWL);  Surgeon: Cleon Gustin, MD;  Location: [facility];  Service: Urology;  Laterality: Right;   KNEE CARTILAGE SURGERY     MOUTH SURGERY     rotator cuff surgery     TONSILLECTOMY      Current Outpatient Medications  Medication Sig Dispense Refill   apixaban (ELIQUIS) 5 MG TABS tablet Take 1 tablet (5 mg total) by mouth 2 (two) times daily. 180 tablet 1   ascorbic acid (VITAMIN C) 500 MG tablet Take 500 mg by mouth daily.     atorvastatin (LIPITOR) 20 MG  tablet Take 20 mg by mouth daily.     Coenzyme Q10 (CO Q-10) 200 MG CAPS Take 1 capsule by mouth in the morning.     desonide (DESOWEN) 0.05 % ointment Apply 1 Application topically 2 (two) times daily as needed (rash).     DULoxetine (CYMBALTA) 60 MG capsule Take 60 mg by mouth daily.     Ginkgo Biloba 120 MG CAPS Take 120 mg by mouth daily.     lisinopril (PRINIVIL,ZESTRIL) 10 MG tablet Take 10 mg by mouth daily.     Melatonin 10 MG TABS Take 20 mg by mouth at bedtime as needed (sleep).     METAMUCIL FIBER PO Take 1 Dose by mouth daily. 1 dose = 1 teaspoon     metoprolol succinate (TOPROL-XL) 25 MG 24 hr tablet Take 25 mg by mouth daily.     pantoprazole (PROTONIX) 40 MG tablet Take 1 tablet (40 mg total) by mouth daily. 45 tablet 0   No current facility-administered medications for this encounter.    Allergies  Allergen Reactions   Amoxicillin Hives   Cefuroxime Rash   Penicillins Hives    Social History   Socioeconomic History   Marital status: Married    Spouse name: Not on file   Number of children: Not on file   Years of education: Not on file   Highest education level: Not on file  Occupational  History   Not on file  Tobacco Use   Smoking status: Never   Smokeless tobacco: Never   Tobacco comments:    Never smoke 09/26/22  Vaping Use   Vaping Use: Never used  Substance and Sexual Activity   Alcohol use: Yes    Alcohol/week: 14.0 standard drinks of alcohol    Types: 14 Standard drinks or equivalent per week    Comment: 2 shots of liquor daily 10/06/22   Drug use: Yes   Sexual activity: Not on file  Other Topics Concern   Not on file  Social History Narrative   Not on file   Social Determinants of Health   Financial Resource Strain: Not on file  Food Insecurity: Not on file  Transportation Needs: Not on file  Physical Activity: Not on file  Stress: Not on file  Social Connections: Not on file  Intimate Partner Violence: Not on file    No family  history on file.  ROS- All systems are reviewed and negative except as per the HPI above  Physical Exam: Vitals:   09/26/22 1127  BP: (!) 144/90  Pulse: 63  Weight: 111.3 kg  Height: 6' (1.829 m)   Wt Readings from Last 3 Encounters:  09/26/22 111.3 kg  08/29/22 108.9 kg  06/16/22 107 kg    Labs: Lab Results  Component Value Date   NA 141 08/12/2022   K 4.4 08/12/2022   CL 102 08/12/2022   CO2 24 08/12/2022   GLUCOSE 139 (H) 08/12/2022   BUN 15 08/12/2022   CREATININE 1.01 08/12/2022   CALCIUM 9.3 08/12/2022   Lab Results  Component Value Date   INR 0.9 10/13/2007   No results found for: "CHOL", "HDL", "LDLCALC", "TRIG"   GEN- The patient is a well appearing male, alert and oriented x 3 today.   HEENT-head normocephalic, atraumatic, sclera clear, conjunctiva pink, hearing intact, trachea midline. Lungs- Clear to ausculation bilaterally, normal work of breathing Heart- Regular rate and rhythm, no murmurs, rubs or gallops  GI- soft, NT, ND, + BS Extremities- no clubbing, cyanosis, or edema MS- no significant deformity or atrophy Skin- no rash or lesion Psych- euthymic mood, full affect Neuro- strength and sensation are intact   EKG today demonstrates SR, slow R wave prog Vent. rate 63 BPM PR interval 192 ms QRS duration 100 ms QT/QTcB 418/427 ms   CHA2DS2-VASc Score = 1  The patient's score is based upon: CHF History: 0 HTN History: 1 Diabetes History: 0 Stroke History: 0 Vascular Disease History: 0 Age Score: 0 Gender Score: 0       ASSESSMENT AND PLAN: 1. Paroxysmal Atrial Fibrillation (ICD10:  I48.0) The patient's CHA2DS2-VASc score is 1, indicating a 0.6% annual risk of stroke.   S/p ablation in 2011 (Dr Ola Spurr) with repeat ablation with Dr Myles Gip on 08/29/22. Reassured patient that some breakthrough episodes of afib are not uncommon soon after ablation.  Continue Eliquis 5 mg BID with no missed doses for 3 months post ablation.   Continue metoprolol succinate  25 mg daily  2. HTN Stable, no changes today.  3. OSA Encouraged compliance with CPAP therapy. Followed by Dr Radford Pax.    Follow up with Dr Myles Gip as scheduled.    [facility] [address] [place], [place] [address] [phone]

## 2022-10-07 ENCOUNTER — Telehealth: Payer: 59 | Admitting: Cardiology

## 2022-11-03 ENCOUNTER — Encounter: Payer: Self-pay | Admitting: Cardiology

## 2022-11-03 ENCOUNTER — Ambulatory Visit: Payer: 59 | Attending: Cardiology | Admitting: Cardiology

## 2022-11-03 DIAGNOSIS — G4733 Obstructive sleep apnea (adult) (pediatric): Secondary | ICD-10-CM | POA: Diagnosis not present

## 2022-11-03 NOTE — Addendum Note (Signed)
Addended by: Luellen Pucker on: 11/03/2022 11:35 AM   Modules accepted: Orders

## 2022-11-03 NOTE — Patient Instructions (Signed)
Medication Instructions:  Your physician recommends that you continue on your current medications as directed. Please refer to the Current Medication list given to you today.  *If you need a refill on your cardiac medications before your next appointment, please call your pharmacy*   Lab Work: None.  If you have labs (blood work) drawn today and your tests are completely normal, you will receive your results only by: MyChart Message (if you have MyChart) OR A paper copy in the mail If you have any lab test that is abnormal or we need to change your treatment, we will call you to review the results.   Testing/Procedures: None.   Follow-Up: At [facility], you and your health needs are our priority.  As part of our continuing mission to provide you with exceptional heart care, we have created designated Provider Care Teams.  These Care Teams include your primary Cardiologist (physician) and Advanced Practice Providers (APPs -  Physician Assistants and Nurse Practitioners) who all work together to provide you with the care you need, when you need it.  We recommend signing up for the patient portal called "MyChart".  Sign up information is provided on this After Visit Summary.  MyChart is used to connect with patients for Virtual Visits (Telemedicine).  Patients are able to view lab/test results, encounter notes, upcoming appointments, etc.  Non-urgent messages can be sent to your provider as well.   To learn more about what you can do with MyChart, go to [email or web address].    Your next appointment:   3 month(s)  Provider:   Dr. Armanda Magic, MD   Other Instructions You have been referred to Dr. Althea Grimmer for an oral appliance to help with your sleep apnea.  [facility] Office - [facility] [address] [place], [place] [address]  Phone: [phone]

## 2022-11-03 NOTE — Progress Notes (Signed)
SLEEP MEDICINE VIRTUAL CONSULT NOTE via Video Note   Because of [name]'s co-morbid illnesses, he is at least at moderate risk for complications without adequate follow up.  This format is felt to be most appropriate for this patient at this time.  All issues noted in this document were discussed and addressed.  A limited physical exam was performed with this format.  Please refer to the patient's chart for his consent to telehealth for [facility].     Date:  11/03/2022   ID:  [name], DOB [birth year], MRN [id number] The patient was identified using 2 identifiers.  Patient Location: Home Provider Location: Office/Clinic   PCP:  Merri Brunette, MD   [facility] Providers Cardiologist:  None Electrophysiologist:  Maurice Small, MD     Evaluation Performed:  Consultation - [name] was referred by [name], MD for the evaluation of OSA.  Chief Complaint:  OSA  History of Present Illness:    [name] is a 67 y.o. male who is being seen today for the evaluation of OSA at the request of [name], MD.  [name] is a 67 y.o. male with a history of hypertension and paroxysmal atrial fibrillation status post ablation.  At his office visit in December with Dr. Shirlyn Goltz he complained of feeling fatigue during the day and due to his atrial fibrillation a home sleep study was ordered.  He would have to nap during the day but attributed that to his age.  He also had problems with snoring during sleep but was never told that he stopped breathing during his sleep.    He underwent home sleep study which showed mild obstructive sleep apnea overall with AHI 5.8/h with most results occurring during supine sleep and severe snoring.  Due to his atrial fibrillation he was started on auto CPAP from 4 to 15 cm H2O.  He is now referred for sleep medicine consultation for further treatment of his sleep apnea.  He is doing well with his PAP  device and sleeps every night with it but does not like it.   He sleeps on his side.  He tolerates the full face mask and feels the pressure is adequate.  Since going on PAP he does not feel like he has to take a nap like he did in the past.  It does take him a while to wake up.   He denies any significant mouth or nasal dryness or nasal congestion.  he does not think that he snores.     Past Medical History:  Diagnosis Date   Anxiety    Dysrhythmia     a fib   Hypertension    OSA on CPAP    mild obstructive sleep apnea overall with AHI 5.8/h   Past Surgical History:  Procedure Laterality Date   ATRIAL ABLATION SURGERY     ATRIAL FIBRILLATION ABLATION N/A 08/29/2022   Procedure: ATRIAL FIBRILLATION ABLATION;  Surgeon: Maurice Small, MD;  [facility]: [facility];  Service: Cardiovascular;  Laterality: N/A;   EXTRACORPOREAL SHOCK WAVE LITHOTRIPSY Right 12/17/2017   Procedure: RIGHT EXTRACORPOREAL SHOCK WAVE LITHOTRIPSY (ESWL);  Surgeon: Malen Gauze, MD;  Location: [facility];  Service: Urology;  Laterality: Right;   KNEE CARTILAGE SURGERY     MOUTH SURGERY     rotator cuff surgery     TONSILLECTOMY       Current Meds  Medication Sig  apixaban (ELIQUIS) 5 MG TABS tablet Take 1 tablet (5 mg total) by mouth 2 (two) times daily.   ascorbic acid (VITAMIN C) 500 MG tablet Take 500 mg by mouth daily.   atorvastatin (LIPITOR) 20 MG tablet Take 20 mg by mouth daily.   Coenzyme Q10 (CO Q-10) 200 MG CAPS Take 1 capsule by mouth in the morning.   desonide (DESOWEN) 0.05 % ointment Apply 1 Application topically 2 (two) times daily as needed (rash).   DULoxetine (CYMBALTA) 60 MG capsule Take 60 mg by mouth daily.   Ginkgo Biloba 120 MG CAPS Take 120 mg by mouth daily.   lisinopril (PRINIVIL,ZESTRIL) 10 MG tablet Take 10 mg by mouth daily.   Melatonin 10 MG TABS Take 20 mg by mouth at bedtime as needed (sleep).   METAMUCIL FIBER PO Take 1 Dose by mouth daily. 1 dose = 1 teaspoon    metoprolol succinate (TOPROL-XL) 25 MG 24 hr tablet Take 25 mg by mouth daily.     Allergies:   Amoxicillin, Cefuroxime, and Penicillins   Social History   Tobacco Use   Smoking status: Never   Smokeless tobacco: Never   Tobacco comments:    Never smoke 09/26/22  Vaping Use   Vaping Use: Never used  Substance Use Topics   Alcohol use: Yes    Alcohol/week: 14.0 standard drinks of alcohol    Types: 14 Standard drinks or equivalent per week    Comment: 2 shots of liquor daily 10/06/22   Drug use: Yes     Family Hx: The patient's family history is not on file.  ROS:   Please see the history of present illness.     All other systems reviewed and are negative.   Prior Sleep studies:   The following studies were reviewed today:  HST and PAP compliance download  Labs/Other Tests and Data Reviewed:     Recent Labs: 08/12/2022: BUN 15; Creatinine, Ser 1.01; Hemoglobin 17.0; Platelets 304; Potassium 4.4; Sodium 141    Wt Readings from Last 3 Encounters:  09/26/22 245 lb 6.4 oz (111.3 kg)  08/29/22 240 lb (108.9 kg)  06/16/22 235 lb 12.8 oz (107 kg)     Risk Assessment/Calculations:    CHA2DS2-VASc Score = 1   This indicates a 0.6% annual risk of stroke. The patient's score is based upon: CHF History: 0 HTN History: 1 Diabetes History: 0 Stroke History: 0 Vascular Disease History: 0 Age Score: 0 Gender Score: 0      STOP-Bang Score:  7      Objective:    Vital Signs:  There were no vitals taken for this visit.   VITAL SIGNS:  reviewed GEN:  no acute distress EYES:  sclerae anicteric, EOMI - Extraocular Movements Intact RESPIRATORY:  normal respiratory effort, symmetric expansion CARDIOVASCULAR:  no peripheral edema SKIN:  no rash, lesions or ulcers. MUSCULOSKELETAL:  no obvious deformities. NEURO:  alert and oriented x 3, no obvious focal deficit PSYCH:  normal affect  ASSESSMENT & PLAN:    OSA - The patient is tolerating PAP therapy well without  any problems. The PAP download performed by his DME was personally reviewed and interpreted by me today and showed an AHI of 5.2 /hr on auto CPAP from 4-15 cm H2O with 100% compliance in using more than 4 hours nightly.  The patient has been using and benefiting from PAP use and will continue to benefit from therapy.  -He has a mask leak likely related to his beard -  he tried a nasal pillow mask but felt like he was suffocating with it -he is not willing to shave his beard to get a better seal so essentially right now the CPAP is not doing much for him  -I will refer him to Sleep dentistry to get fitted for an oral device   Time:   Today, I have spent 15 minutes with the patient with telehealth technology discussing the above problems.     Medication Adjustments/Labs and Tests Ordered: Current medicines are reviewed at length with the patient today.  Concerns regarding medicines are outlined above.   Tests Ordered: No orders of the defined types were placed in this encounter.   Medication Changes: No orders of the defined types were placed in this encounter.   Follow Up:  In Person in 3 month(s)  Signed, Armanda Magic, MD  11/03/2022 11:07 AM    [facility]

## 2022-11-26 ENCOUNTER — Ambulatory Visit: Payer: 59 | Attending: Cardiovascular Disease | Admitting: Cardiovascular Disease

## 2022-11-26 ENCOUNTER — Encounter: Payer: Self-pay | Admitting: Cardiovascular Disease

## 2022-11-26 VITALS — BP 130/68 | HR 57 | Ht 72.0 in | Wt 244.0 lb

## 2022-11-26 DIAGNOSIS — I480 Paroxysmal atrial fibrillation: Secondary | ICD-10-CM | POA: Diagnosis not present

## 2022-11-26 NOTE — Progress Notes (Signed)
Electrophysiology Office Note:    Date:  11/26/2022   ID:  [name], DOB 1956/04/27, MRN [id number]  PCP:  Merri Brunette, MD   [facility] Providers Cardiologist:  None Electrophysiologist:  Maurice Small, MD     Referring MD: Merri Brunette, MD   History of Present Illness:    [name] is a 67 y.o. male with a hx listed below, significant for ablation for atrial fibrillation, referred for arrhythmia management.  He had an ablation for atrial fibrillation in 2011 performed in [facility] by dr. Sampson Goon. He had been doing very well until October. He noticed recurrence of AF symptoms after recovering from COVID. Metoprolol was restarted.  He has fatigue and some palpitations and was acutely aware when he had recurrence of AF.  He underwent repeat atrial fibrillation ablation on August 29, 2022.  The right veins had reconnected and required complete re-encirculation. The left veins required reinforcement.  He has been diagnosed with sleep apnea and has been tolerating PAP therapy.  He recalls having a few episodes of atrial fibrillation in the postop period but has been doing very well and not had a single recurrence since.   Past Medical History:  Diagnosis Date   Anxiety    Dysrhythmia     a fib   Hypertension    OSA on CPAP    mild obstructive sleep apnea overall with AHI 5.8/h    Past Surgical History:  Procedure Laterality Date   ATRIAL ABLATION SURGERY     ATRIAL FIBRILLATION ABLATION N/A 08/29/2022   Procedure: ATRIAL FIBRILLATION ABLATION;  Surgeon: Maurice Small, MD;  [facility]: [facility];  Service: Cardiovascular;  Laterality: N/A;   EXTRACORPOREAL SHOCK WAVE LITHOTRIPSY Right 12/17/2017   Procedure: RIGHT EXTRACORPOREAL SHOCK WAVE LITHOTRIPSY (ESWL);  Surgeon: Malen Gauze, MD;  Location: [facility];  Service: Urology;  Laterality: Right;   KNEE CARTILAGE SURGERY     MOUTH SURGERY     rotator cuff surgery      TONSILLECTOMY      Current Medications: Current Meds  Medication Sig   apixaban (ELIQUIS) 5 MG TABS tablet Take 1 tablet (5 mg total) by mouth 2 (two) times daily.   ascorbic acid (VITAMIN C) 500 MG tablet Take 500 mg by mouth daily.   atorvastatin (LIPITOR) 20 MG tablet Take 20 mg by mouth daily.   Coenzyme Q10 (CO Q-10) 200 MG CAPS Take 1 capsule by mouth in the morning.   desonide (DESOWEN) 0.05 % ointment Apply 1 Application topically 2 (two) times daily as needed (rash).   DULoxetine (CYMBALTA) 60 MG capsule Take 60 mg by mouth daily.   Ginkgo Biloba 120 MG CAPS Take 120 mg by mouth daily.   lisinopril (PRINIVIL,ZESTRIL) 10 MG tablet Take 10 mg by mouth daily.   Melatonin 10 MG TABS Take 20 mg by mouth at bedtime as needed (sleep).   METAMUCIL FIBER PO Take 1 Dose by mouth daily. 1 dose = 1 teaspoon   metoprolol succinate (TOPROL-XL) 25 MG 24 hr tablet Take 25 mg by mouth daily.     Allergies:   Amoxicillin, Cefuroxime, and Penicillins   Social History   Socioeconomic History   Marital status: Married    Spouse name: Not on file   Number of children: Not on file   Years of education: Not on file   Highest education level: Not on file  Occupational History   Not on file  Tobacco Use  Smoking status: Never   Smokeless tobacco: Never   Tobacco comments:    Never smoke 09/26/22  Vaping Use   Vaping Use: Never used  Substance and Sexual Activity   Alcohol use: Yes    Alcohol/week: 14.0 standard drinks of alcohol    Types: 14 Standard drinks or equivalent per week    Comment: 2 shots of liquor daily 10/06/22   Drug use: Yes   Sexual activity: Not on file  Other Topics Concern   Not on file  Social History Narrative   Not on file   Social Determinants of Health   Financial Resource Strain: Not on file  Food Insecurity: Not on file  Transportation Needs: Not on file  Physical Activity: Not on file  Stress: Not on file  Social Connections: Not on file      Family History: The patient's family history is not on file.  ROS:   Please see the history of present illness.    All other systems reviewed and are negative.  EKGs/Labs/Other Studies Reviewed Today:     ECG from 10/26, reviewed by me, showed AF  EKG:  Last EKG results: today - coarse AF, possibly atypical flutter   Recent Labs: 08/12/2022: BUN 15; Creatinine, Ser 1.01; Hemoglobin 17.0; Platelets 304; Potassium 4.4; Sodium 141     Physical Exam:    VS:  BP 130/68   Pulse (!) 57   Ht 6' (1.829 m)   Wt 244 lb (110.7 kg)   SpO2 98%   BMI 33.09 kg/m     Wt Readings from Last 3 Encounters:  11/26/22 244 lb (110.7 kg)  09/26/22 245 lb 6.4 oz (111.3 kg)  08/29/22 240 lb (108.9 kg)     GEN: Well nourished, well developed in no acute distress, obese CARDIAC: Irregular rhythm, no murmurs, rubs, gallops RESPIRATORY:  Normal work of breathing MUSCULOSKELETAL: no edema    ASSESSMENT & PLAN:    Atrial fibrillation: possibly organizing into atypical flutter. Rate controlled on metoprolol. He is symptoamtic. S/p ablation in 2011. Repeat ablation 08/2022 showed complete reconnection of the right veins. Doing well after ablation. He would like to DC metoprolol -- I think this is ok. Will have him discontinue it. Secondary hypercoagulable state. CHADS2Vasc is 1 for hypertension. Will be 2 when he turns 67 next year. Continue eliquis. OSA: now on CPAP, still having apnea episodes. Will see dentist for oral device        Medication Adjustments/Labs and Tests Ordered: Current medicines are reviewed at length with the patient today.  Concerns regarding medicines are outlined above.  No orders of the defined types were placed in this encounter.  No orders of the defined types were placed in this encounter.    Signed, Maurice Small, MD  11/26/2022 12:02 PM    [facility]

## 2022-11-26 NOTE — Patient Instructions (Signed)
Medication Instructions:  STOP metoprolol   *If you need a refill on your cardiac medications before your next appointment, please call your pharmacy*   Follow-Up: At [facility], you and your health needs are our priority.  As part of our continuing mission to provide you with exceptional heart care, we have created designated Provider Care Teams.  These Care Teams include your primary Cardiologist (physician) and Advanced Practice Providers (APPs -  Physician Assistants and Nurse Practitioners) who all work together to provide you with the care you need, when you need it.  We recommend signing up for the patient portal called "MyChart".  Sign up information is provided on this After Visit Summary.  MyChart is used to connect with patients for Virtual Visits (Telemedicine).  Patients are able to view lab/test results, encounter notes, upcoming appointments, etc.  Non-urgent messages can be sent to your provider as well.   To learn more about what you can do with MyChart, go to [email or web address].    Your next appointment:   6 month(s)  Provider:   [name], MD

## 2022-12-01 ENCOUNTER — Other Ambulatory Visit (HOSPITAL_COMMUNITY): Payer: Self-pay | Admitting: *Deleted

## 2022-12-01 MED ORDER — APIXABAN 5 MG PO TABS: 5.0000 mg | ORAL_TABLET | Freq: Two times a day (BID) | ORAL | 1 refills | Status: DC

## 2022-12-09 ENCOUNTER — Encounter: Payer: Self-pay | Admitting: Cardiovascular Disease

## 2022-12-12 MED ORDER — METOPROLOL SUCCINATE ER 25 MG PO TB24: 25.0000 mg | ORAL_TABLET | Freq: Every day | ORAL | 3 refills | Status: DC

## 2023-02-09 ENCOUNTER — Ambulatory Visit: Payer: 59 | Admitting: Cardiology

## 2023-03-02 ENCOUNTER — Encounter: Payer: Self-pay | Admitting: Cardiovascular Disease

## 2023-03-03 NOTE — Telephone Encounter (Signed)
Spoke with patient, experiencing more frequent AF episodes (daily, last 53min-1h, with some SOB). Moved follow up visit up with Dr Nelly Laurence as patient would like to discuss plan of care moving forward (need antiarrhythmics at this point?) New appointment on 03/10/23 at 10:15 am, no further needs at this time

## 2023-03-10 ENCOUNTER — Ambulatory Visit: Payer: 59 | Attending: Cardiovascular Disease | Admitting: Cardiovascular Disease

## 2023-03-10 ENCOUNTER — Encounter: Payer: Self-pay | Admitting: Cardiovascular Disease

## 2023-03-10 ENCOUNTER — Ambulatory Visit (INDEPENDENT_AMBULATORY_CARE_PROVIDER_SITE_OTHER): Payer: 59

## 2023-03-10 VITALS — BP 160/100 | HR 60 | Ht 72.0 in | Wt 247.8 lb

## 2023-03-10 DIAGNOSIS — I480 Paroxysmal atrial fibrillation: Secondary | ICD-10-CM

## 2023-03-10 MED ORDER — LISINOPRIL 20 MG PO TABS: 20.0000 mg | ORAL_TABLET | Freq: Every day | ORAL | 3 refills | Status: DC

## 2023-03-10 NOTE — Progress Notes (Unsigned)
Enrolled for [employer] to mail a ZIO XT long term holter monitor to the patients address on file.  

## 2023-03-10 NOTE — Progress Notes (Signed)
Electrophysiology Office Note:    Date:  03/10/2023   ID:  [name], DOB 01/07/1956, MRN [id number]  PCP:  Eric Brunette, MD   [facility] Providers Cardiologist:  None Electrophysiologist:  Maurice Small, MD     Referring MD: Eric Brunette, MD   History of Present Illness:    [name] is a 67 y.o. male with a hx listed below, significant for ablation for atrial fibrillation, referred for arrhythmia management.    He had an ablation for atrial fibrillation in 2011 performed in [facility] by dr. Sampson Goon. He had been doing very well until October. He noticed recurrence of AF symptoms after recovering from COVID. Metoprolol was restarted.  He has fatigue and some palpitations and was acutely aware when he had recurrence of AF.  He underwent repeat atrial fibrillation ablation on August 29, 2022.  The right veins had reconnected and required complete re-encirculation. The left veins required reinforcement.    He was diagnosed with sleep apnea and has been tolerating PAP therapy.  He recalls having a few episodes of atrial fibrillation in the postop period but remained AF free for months until he began having recurrences in August, 2024.     Since her last visit, he has begun to have recurrence of palpitations and fatigue.  These feel similar to his prior A-fib episodes.  He notices occasional skipped beats.   EKGs/Labs/Other Studies Reviewed Today:     ECG from 10/26, reviewed by me, showed AF  EKG:   EKG Interpretation Date/Time:  Tuesday March 10 2023 10:36:58 EDT Ventricular Rate:  60 PR Interval:  186 QRS Duration:  92 QT Interval:  422 QTC Calculation: 422 R Axis:   -5  Text Interpretation: Normal sinus rhythm Normal ECG When compared with ECG of 26-Sep-2022 11:30, No significant change was found Confirmed by [name] [phone]) on 03/10/2023 10:44:21 AM     Recent Labs: 08/12/2022: BUN 15; Creatinine, Ser 1.01; Hemoglobin 17.0;  Platelets 304; Potassium 4.4; Sodium 141   EKG Interpretation Date/Time:  Tuesday March 10 2023 10:36:58 EDT Ventricular Rate:  60 PR Interval:  186 QRS Duration:  92 QT Interval:  422 QTC Calculation: 422 R Axis:   -5  Text Interpretation: Normal sinus rhythm Normal ECG When compared with ECG of 26-Sep-2022 11:30, No significant change was found Confirmed by [name] [phone]) on 03/10/2023 10:44:21 AM   Physical Exam:    VS:  BP (!) 160/100 (BP Location: Left Arm, Patient Position: Sitting, Cuff Size: Large)   Pulse 60   Ht 6' (1.829 m)   Wt 247 lb 12.8 oz (112.4 kg)   SpO2 98%   BMI 33.61 kg/m     Wt Readings from Last 3 Encounters:  03/10/23 247 lb 12.8 oz (112.4 kg)  11/26/22 244 lb (110.7 kg)  09/26/22 245 lb 6.4 oz (111.3 kg)     GEN: Well nourished, well developed in no acute distress, obese CARDIAC: Irregular rhythm, no murmurs, rubs, gallops RESPIRATORY:  Normal work of breathing MUSCULOSKELETAL: no edema    ASSESSMENT & PLAN:    Atrial fibrillation:  possibly organizing into atypical flutter.  Rate controlled on metoprolol. He is symptomatic.  S/p ablation in 2011. Repeat ablation 08/2022 showed complete reconnection of the right veins.  Now again with recurrence of palpitations.  Will place a 7-day monitor to determine whether he is having recurrence of atrial fibrillation or some other arrhythmia.  Will need to consider repeat ablation depending upon  results.  Secondary hypercoagulable state.  CHADS2Vasc is 1 for hypertension; will be 2 when he turns 67 next month. Continue eliquis.  OSA:  now on CPAP for mild OSA -- not working He has been referred for an oral appliance  Coronary artery calcium 94th percentile for age and sex matched peers To Clyde Lundborg atorvastatin 20, apixaban 5, metoprolol XL 25  Hypertension On Toprol XL 25, lisinopril 10 Will increase lisinopril to 20        Medication Adjustments/Labs and Tests Ordered: Current  medicines are reviewed at length with the patient today.  Concerns regarding medicines are outlined above.  Orders Placed This Encounter  Procedures   EKG 12-Lead   No orders of the defined types were placed in this encounter.    Signed, Maurice Small, MD  03/10/2023 10:44 AM    [facility]

## 2023-03-10 NOTE — Patient Instructions (Signed)
Medication Instructions:  Increase Lisinopril to 20 mg once daily  *If you need a refill on your cardiac medications before your next appointment, please call your pharmacy*   Testing/Procedures: Zio Cardiac Monitor  Your physician has recommended that you wear an event monitor. Event monitors are medical devices that record the heart's electrical activity. Doctors most often [place] these monitors to diagnose arrhythmias. Arrhythmias are problems with the speed or rhythm of the heartbeat. The monitor is a small, portable device. You can wear one while you do your normal daily activities. This is usually used to diagnose what is causing palpitations/syncope (passing out).   Follow-Up: At [facility], you and your health needs are our priority.  As part of our continuing mission to provide you with exceptional heart care, we have created designated Provider Care Teams.  These Care Teams include your primary Cardiologist (physician) and Advanced Practice Providers (APPs -  Physician Assistants and Nurse Practitioners) who all work together to provide you with the care you need, when you need it.  We recommend signing up for the patient portal called "MyChart".  Sign up information is provided on this After Visit Summary.  MyChart is used to connect with patients for Virtual Visits (Telemedicine).  Patients are able to view lab/test results, encounter notes, upcoming appointments, etc.  Non-urgent messages can be sent to your provider as well.   To learn more about what you can do with MyChart, go to [email or web address].    Your next appointment:   After your cardiac monitor is complete (4 - 6 weeks)  Provider:   [name], MD

## 2023-03-13 DIAGNOSIS — I480 Paroxysmal atrial fibrillation: Secondary | ICD-10-CM

## 2023-04-20 ENCOUNTER — Ambulatory Visit: Payer: 59 | Admitting: Cardiology

## 2023-05-04 ENCOUNTER — Ambulatory Visit: Payer: Medicare Other | Admitting: Cardiovascular Disease

## 2023-05-11 ENCOUNTER — Encounter: Payer: Self-pay | Admitting: Cardiovascular Disease

## 2023-05-11 ENCOUNTER — Ambulatory Visit: Payer: Medicare Other | Attending: Cardiovascular Disease | Admitting: Cardiovascular Disease

## 2023-05-11 VITALS — BP 128/84 | HR 63 | Ht 72.0 in | Wt 246.6 lb

## 2023-05-11 DIAGNOSIS — I480 Paroxysmal atrial fibrillation: Secondary | ICD-10-CM | POA: Diagnosis not present

## 2023-05-11 NOTE — Patient Instructions (Signed)
Medication Instructions:  Your physician recommends that you continue on your current medications as directed. Please refer to the Current Medication list given to you today. *If you need a refill on your cardiac medications before your next appointment, please call your pharmacy*   Follow-Up: At [facility], you and your health needs are our priority.  As part of our continuing mission to provide you with exceptional heart care, we have created designated Provider Care Teams.  These Care Teams include your primary Cardiologist (physician) and Advanced Practice Providers (APPs -  Physician Assistants and Nurse Practitioners) who all work together to provide you with the care you need, when you need it.  We recommend signing up for the patient portal called "MyChart".  Sign up information is provided on this After Visit Summary.  MyChart is used to connect with patients for Virtual Visits (Telemedicine).  Patients are able to view lab/test results, encounter notes, upcoming appointments, etc.  Non-urgent messages can be sent to your provider as well.   To learn more about what you can do with MyChart, go to [email or web address].    Your next appointment:   6 month(s)  Provider:   Augustus Mealor, MD  

## 2023-05-11 NOTE — Progress Notes (Signed)
Electrophysiology Office Note:    Date:  05/11/2023   ID:  [name], DOB [birth year], MRN [id number]  PCP:  Merri Brunette, MD   [facility] Providers Cardiologist:  None Electrophysiologist:  Maurice Small, MD     Referring MD: Merri Brunette, MD   History of Present Illness:    [name] is a 67 y.o. male with a hx listed below, significant for ablation for atrial fibrillation, referred for arrhythmia management.    He had an ablation for atrial fibrillation in 2011 performed in [facility] by dr. Sampson Goon. He had been doing very well until October. He noticed recurrence of AF symptoms after recovering from COVID. Metoprolol was restarted.  He has fatigue and some palpitations and was acutely aware when he had recurrence of AF.  He underwent repeat atrial fibrillation ablation on August 29, 2022.  The right veins had reconnected and required complete re-encirculation. The left veins required reinforcement.    He was diagnosed with sleep apnea and has been tolerating PAP therapy.  He recalls having a few episodes of atrial fibrillation in the postop period but remained AF free for months until he began having recurrences in August, 2024.     At her last visit, he noticed increasing episodes of atrial fibrillation so a monitor was placed.  He did not have any episodes that he thought was atrial fibrillation during the monitoring period, and the monitor did not show any atrial fibrillation.  He has only had 1 episode since then.  EKGs/Labs/Other Studies Reviewed Today:    Mobitor Monitoring time: 7 days   Predominant rhythm: sinus Sinus HR: 49 - 95, AVG 61 bpm   < 1% atrial ectopy < 1% ventricular ectopy   Arrhythmia detected: Brief atrial tachycardia, longest 18.7 seconds   Patient triggered events: correlated with PACs   ECG from 10/26, reviewed by me, showed AF  EKG:   EKG Interpretation Date/Time:  Monday May 11 2023 09:25:53  EDT Ventricular Rate:  63 PR Interval:  206 QRS Duration:  92 QT Interval:  426 QTC Calculation: 435 R Axis:   9  Text Interpretation: Sinus rhythm with PVCs or PACs with aberrant conduction When compared with ECG of 10-Mar-2023 10:36, Abberant conduction is now Present Confirmed by [name] [phone]) on 05/11/2023 9:40:19 AM     Recent Labs: 08/12/2022: BUN 15; Creatinine, Ser 1.01; Hemoglobin 17.0; Platelets 304; Potassium 4.4; Sodium 141      Physical Exam:    VS:  BP 128/84 (BP Location: Left Arm, Patient Position: Sitting, Cuff Size: Large)   Pulse 63   Ht 6' (1.829 m)   Wt 246 lb 9.6 oz (111.9 kg)   SpO2 97%   BMI 33.44 kg/m     Wt Readings from Last 3 Encounters:  05/11/23 246 lb 9.6 oz (111.9 kg)  03/10/23 247 lb 12.8 oz (112.4 kg)  11/26/22 244 lb (110.7 kg)     GEN: Well nourished, well developed in no acute distress, obese CARDIAC: Irregular rhythm, no murmurs, rubs, gallops RESPIRATORY:  Normal work of breathing MUSCULOSKELETAL: no edema    ASSESSMENT & PLAN:    Atrial fibrillation:  possibly organizing into atypical flutter.  Rate controlled on metoprolol. He is symptomatic.  S/p ablation in 2011. Repeat ablation 08/2022 showed complete reconnection of the right veins.  Now again with recurrence of palpitations.  Monitor did not show atrial fibrillation, but patient reports that he did not have any episodes during the monitoring  period. I advised him to get an Education officer, environmental or Apple Watch  Secondary hypercoagulable state.  CHADS2Vasc is 2 for hypertension, age  Continue eliquis 5  OSA:  now on CPAP for mild OSA -- not working He has been referred for an oral appliance  Coronary artery calcium 94th percentile for age and sex matched peers Continue  atorvastatin 20, apixaban 5, metoprolol XL 25  Hypertension On Toprol XL 25, lisinopril 20         Medication Adjustments/Labs and Tests Ordered: Current medicines are reviewed at length with  the patient today.  Concerns regarding medicines are outlined above.  Orders Placed This Encounter  Procedures   EKG 12-Lead   No orders of the defined types were placed in this encounter.    Signed, Maurice Small, MD  05/11/2023 9:40 AM    [facility]

## 2023-05-19 ENCOUNTER — Other Ambulatory Visit (HOSPITAL_COMMUNITY): Payer: Self-pay | Admitting: Physician Assistant

## 2023-05-20 NOTE — Telephone Encounter (Signed)
Prescription refill request for Eliquis received. Indication: PAF Last office visit: 05/11/23  A Mealor MD Scr: 1.01 on 08/12/22  Epic Age: 67 Weight: 111.9kg  Based on above findings Eliquis 5mg  twice daily is the appropriate dose.  Refill approved.

## 2023-05-26 ENCOUNTER — Ambulatory Visit: Payer: 59 | Admitting: Cardiovascular Disease

## 2023-06-22 ENCOUNTER — Telehealth: Payer: Self-pay

## 2023-06-22 ENCOUNTER — Telehealth: Payer: Self-pay | Admitting: *Deleted

## 2023-06-22 ENCOUNTER — Ambulatory Visit: Payer: Medicare Other | Attending: Student | Admitting: Student

## 2023-06-22 DIAGNOSIS — Z01810 Encounter for preprocedural cardiovascular examination: Secondary | ICD-10-CM

## 2023-06-22 NOTE — Telephone Encounter (Signed)
Patient with diagnosis of afib on Eliquis for anticoagulation.    Procedure:  LUMBAR SNRB  Date of procedure: 06/25/23   CHA2DS2-VASc Score = 3   This indicates a 3.2% annual risk of stroke. The patient's score is based upon: CHF History: 0 HTN History: 1 Diabetes History: 0 Stroke History: 0 Vascular Disease History: 1 Age Score: 1 Gender Score: 0      CrCl 93 ml/min Platelet count 304  Per office protocol, patient can hold Eliquis for 3 days prior to procedure.    **This guidance is not considered finalized until pre-operative APP has relayed final recommendations.**

## 2023-06-22 NOTE — Telephone Encounter (Signed)
I was able to reach the pt and he is agreeable to tele add on today 3:20. Med rec and consent are done.     Patient Consent for Virtual Visit        [name] has provided verbal consent on 06/22/2023 for a virtual visit (video or telephone).   CONSENT FOR VIRTUAL VISIT FOR:  [name]  By participating in this virtual visit I agree to the following:  I hereby voluntarily request, consent and authorize [facility] and its employed or contracted physicians, physician assistants, nurse practitioners or other licensed health care professionals (the Practitioner), to provide me with telemedicine health care services (the "Services") as deemed necessary by the treating Practitioner. I acknowledge and consent to receive the Services by the Practitioner via telemedicine. I understand that the telemedicine visit will involve communicating with the Practitioner through live audiovisual communication technology and the disclosure of certain medical information by electronic transmission. I acknowledge that I have been given the opportunity to request an in-person assessment or other available alternative prior to the telemedicine visit and am voluntarily participating in the telemedicine visit.  I understand that I have the right to withhold or withdraw my consent to the use of telemedicine in the course of my care at any time, without affecting my right to future care or treatment, and that the Practitioner or I may terminate the telemedicine visit at any time. I understand that I have the right to inspect all information obtained and/or recorded in the course of the telemedicine visit and may receive copies of available information for a reasonable fee.  I understand that some of the potential risks of receiving the Services via telemedicine include:  Delay or interruption in medical evaluation due to technological equipment failure or disruption; Information transmitted may not be  sufficient (e.g. poor resolution of images) to allow for appropriate medical decision making by the Practitioner; and/or  In rare instances, security protocols could fail, causing a breach of personal health information.  Furthermore, I acknowledge that it is my responsibility to provide information about my medical history, conditions and care that is complete and accurate to the best of my ability. I acknowledge that Practitioner's advice, recommendations, and/or decision may be based on factors not within their control, such as incomplete or inaccurate data provided by me or distortions of diagnostic images or specimens that may result from electronic transmissions. I understand that the practice of medicine is not an exact science and that Practitioner makes no warranties or guarantees regarding treatment outcomes. I acknowledge that a copy of this consent can be made available to me via my patient portal [facility] MyChart), or I can request a printed copy by calling the office of [facility].    I understand that my insurance will be billed for this visit.   I have read or had this consent read to me. I understand the contents of this consent, which adequately explains the benefits and risks of the Services being provided via telemedicine.  I have been provided ample opportunity to ask questions regarding this consent and the Services and have had my questions answered to my satisfaction. I give my informed consent for the services to be provided through the use of telemedicine in my medical care

## 2023-06-22 NOTE — Telephone Encounter (Signed)
   Name: [name]  DOB: 05/09/1956  MRN: [id number]  Primary Cardiologist: None Procedure: Lumbar SNRB  Preoperative team, please contact this patient and set up a phone call appointment for further preoperative risk assessment. Please obtain consent and complete medication review. Thank you for your help.  I confirm that guidance regarding antiplatelet and oral anticoagulation therapy has been completed and, if necessary, noted below.  Per office protocol and PharmD, patient can hold Eliquis for 3 days prior to procedure, please resume when safe to do so from a bleeding standpoint.   I also confirmed the patient resides in the state of [name]. As per [facility] Medical Board telemedicine laws, the patient must reside in the state in which the provider is licensed.   Rip Harbour, NP 06/22/2023, 1:51 PM  [facility]

## 2023-06-22 NOTE — Progress Notes (Signed)
Virtual Visit via Telephone Note   Because of [name]'s co-morbid illnesses, he is at least at moderate risk for complications without adequate follow up.  This format is felt to be most appropriate for this patient at this time.  The patient did not have access to video technology/had technical difficulties with video requiring transitioning to audio format only (telephone).  All issues noted in this document were discussed and addressed.  No physical exam could be performed with this format.  Please refer to the patient's chart for his consent to telehealth for [facility].  Evaluation Performed:  Preoperative cardiovascular risk assessment _____________   Date:  06/22/2023   Patient ID:  [name], DOB 1955/11/11, MRN [id number] Patient Location:  Home Provider location:   Office  Primary Care Provider:  Merri Brunette, MD Primary Cardiologist:  Mealor, Roberts Gaudy, MD  Chief Complaint / Patient Profile   67 y.o. y/o male with a h/o PAF on anticoagulation, OSA on CPAP who is pending lumbar selective nerve root block by Dr. Drucie Ip and presents today for telephonic preoperative cardiovascular risk assessment.  History of Present Illness    [name] is a 67 y.o. male who presents via audio/video conferencing for a telehealth visit today.  Pt was last seen in cardiology clinic on 05/11/2023 by Dr. Nelly Laurence.  At that time [name] was stable from a cardiac standpoint.  The patient is now pending procedure as outlined above. Since his last visit, he is doing well. Patient denies shortness of breath, dyspnea on exertion, lower extremity edema, orthopnea or PND. No chest pain, pressure, or tightness. No palpitations since August. Patient's activity is limited secondary to back pain. He is independent with ADLs and able to do some things around the house and was able to perform yard work recently.   Past Medical History    Past Medical History:  Diagnosis Date    Anxiety    Dysrhythmia     a fib   Hypertension    OSA on CPAP    mild obstructive sleep apnea overall with AHI 5.8/h   Past Surgical History:  Procedure Laterality Date   ATRIAL ABLATION SURGERY     ATRIAL FIBRILLATION ABLATION N/A 08/29/2022   Procedure: ATRIAL FIBRILLATION ABLATION;  Surgeon: Maurice Small, MD;  Location: [facility] INVASIVE CV LAB;  Service: Cardiovascular;  Laterality: N/A;   EXTRACORPOREAL SHOCK WAVE LITHOTRIPSY Right 12/17/2017   Procedure: RIGHT EXTRACORPOREAL SHOCK WAVE LITHOTRIPSY (ESWL);  Surgeon: Malen Gauze, MD;  Location: [facility] ORS;  Service: Urology;  Laterality: Right;   KNEE CARTILAGE SURGERY     MOUTH SURGERY     rotator cuff surgery     TONSILLECTOMY      Allergies  Allergies  Allergen Reactions   Amoxicillin Hives   Cefuroxime Rash   Penicillins Hives    Home Medications    Prior to Admission medications   Medication Sig Start Date End Date Taking? Authorizing Provider  amLODipine (NORVASC) 10 MG tablet Take 10 mg by mouth daily. 03/31/23   [provider]  apixaban (ELIQUIS) 5 MG TABS tablet TAKE 1 TABLET BY MOUTH TWICE  DAILY 05/20/23   Mealor, Roberts Gaudy, MD  ascorbic acid (VITAMIN C) 500 MG tablet Take 500 mg by mouth daily.    [provider]  atorvastatin (LIPITOR) 20 MG tablet Take 20 mg by mouth daily. 03/30/22   [provider]  Coenzyme Q10 (CO Q-10) 200 MG CAPS Take  1 capsule by mouth in the morning. 05/13/22   [provider]  desonide (DESOWEN) 0.05 % ointment Apply 1 Application topically 2 (two) times daily as needed (rash). 07/11/22   [provider]  DULoxetine (CYMBALTA) 60 MG capsule Take 60 mg by mouth daily.    [provider]  Ginkgo Biloba 120 MG CAPS Take 120 mg by mouth daily.    [provider]  lisinopril (ZESTRIL) 20 MG tablet Take 1 tablet (20 mg total) by mouth daily. 03/10/23 06/22/23  Mealor, Roberts Gaudy, MD  Melatonin 10 MG TABS Take 20 mg by mouth at  bedtime as needed (sleep).    [provider]  METAMUCIL FIBER PO Take 1 Dose by mouth daily. 1 dose = 1 teaspoon    [provider]  metoprolol succinate (TOPROL-XL) 25 MG 24 hr tablet Take 1 tablet (25 mg total) by mouth daily. 12/12/22   Mealor, Roberts Gaudy, MD  pregabalin (LYRICA) 50 MG capsule Take 100 mg by mouth 2 (two) times daily. 06/15/23   [provider]    Physical Exam    Vital Signs:  [name] does not have vital signs available for review today.  Given telephonic nature of communication, physical exam is limited. AAOx3. NAD. Normal affect.  Speech and respirations are unlabored.  Accessory Clinical Findings    None  Assessment & Plan    Primary Cardiologist: [name], MD  Preoperative cardiovascular risk assessment. Lumbar selective nerve root block by Dr. Drucie Ip on 06/25/2023.  Chart reviewed as part of pre-operative protocol coverage. According to the RCRI, patient has a 0.4% risk of MACE. Patient reports activity equivalent to 5.19 METS (per DASI).   Given past medical history and time since last visit, based on ACC/AHA guidelines, [name] would be at acceptable risk for the planned procedure without further cardiovascular testing.   Patient was advised that if he develops new symptoms prior to surgery to contact our office to arrange a follow-up appointment.  he verbalized understanding.  Per Pharm D, patient may hold Eliquis for 3 days prior to procedure. **Patient took last dose of Eliquis this AM 06/22/2023.     I will route this recommendation to the requesting party via Epic fax function.  Please call with questions.  Time:   Today, I have spent 6 minutes with the patient with telehealth technology discussing medical history, symptoms, and management plan.     Eric Levering, [name]  06/22/2023, 3:17 PM

## 2023-06-22 NOTE — Telephone Encounter (Signed)
..     Pre-operative Risk Assessment    Patient Name: [name]  DOB: [birth year] MRN: [id number]      Request for Surgical Clearance    Procedure:   LUMBAR SNRB  Date of Surgery:  Clearance 06/25/23                                 Surgeon:  DR Drucie Ip Surgeon's Group or Practice Name:  [facility] Phone number:  [phone] Fax number:  [phone]   Type of Clearance Requested:   - Medical  - Pharmacy:  Hold Apixaban (Eliquis) HOLD FOR 3 DAYS   Type of Anesthesia:  Not Indicated   Additional requests/questions:   LAST O/V 05/10/24 , NEXT APPT 07/20/23  Signed, Renee Ramus   06/22/2023, 12:43 PM

## 2023-06-22 NOTE — Telephone Encounter (Signed)
I was able to reach the pt and he is agreeable to tele add on today 3:20. Med rec and consent are done.

## 2023-06-22 NOTE — Telephone Encounter (Signed)
Left message per preop APP today to add pt on for tele preop clearance due to procedure 06/25/23 and med hold blood thinner x 3 days.

## 2023-06-22 NOTE — Telephone Encounter (Signed)
Please advise holding Eliquis prior to lumbar nerve root block.  Thank you!  DW

## 2023-07-17 ENCOUNTER — Telehealth: Payer: Self-pay

## 2023-07-17 NOTE — Telephone Encounter (Signed)
   Primary Cardiologist: None  Chart reviewed as part of pre-operative protocol coverage. Given past medical history and time since last visit, based on ACC/AHA guidelines, Ryman O Gass would be at acceptable risk for the planned procedure without further cardiovascular testing. I contacted patient to ensure no changes in medical history and no new symptoms since virtual visit on 06/22/23.   Patient was advised that if he develops new symptoms prior to surgery to contact our office to arrange a follow-up appointment.  He verbalized understanding.  Per office protocol, patient can hold Eliquis  for 3 days prior to procedure.    I will route this recommendation to the requesting party via Epic fax function and remove from pre-op pool.  Please call with questions.  Rosaline EMERSON Bane, NP-C  07/17/2023, 2:32 PM [address] Office [phone] Fax [phone]

## 2023-07-17 NOTE — Telephone Encounter (Signed)
   Pre-operative Risk Assessment    Patient Name: [name]  DOB: Oct 17, 1955 MRN: [id number]   Date of last office visit: 05/01/23 Dr. Nancey Date of next office visit: None   Request for Surgical Clearance    Procedure:   Sublaminar Decompression RT L2-L3  Date of Surgery:  Clearance TBD                                Surgeon:  Dr. Elnor P. Onetha Socks Group or Practice Name:  [facility] Phone number:  [phone] EXT: [phone] Fax number:  [phone]   Type of Clearance Requested:   - Medical  - Pharmacy:  Hold Apixaban  (Eliquis )     Type of Anesthesia:  General    Additional requests/questions:    Bonney Huxley Kostantinos Tallman   07/17/2023, 10:41 AM

## 2023-07-17 NOTE — Telephone Encounter (Signed)
 Patient with diagnosis of afib on Eliquis  for anticoagulation.    Procedure: Sublaminar Decompression RT L2-L3  Date of procedure: TBD   CHA2DS2-VASc Score = 3   This indicates a 3.2% annual risk of stroke. The patient's score is based upon: CHF History: 0 HTN History: 1 Diabetes History: 0 Stroke History: 0 Vascular Disease History: 1 Age Score: 1 Gender Score: 0      CrCl 93 ml/min Platelet count 304  Per office protocol, patient can hold Eliquis  for 3 days prior to procedure.    **This guidance is not considered finalized until pre-operative APP has relayed final recommendations.**

## 2023-07-20 ENCOUNTER — Telehealth: Payer: Medicare Other | Admitting: Cardiology

## 2023-07-24 NOTE — Progress Notes (Addendum)
 Surgical Instructions   Your procedure is scheduled on July 29, 2023. Report to [facility] Main Entrance A at 1026 A.M., then check in with the Admitting office. Any questions or running late day of surgery: call [phone]  Questions prior to your surgery date: call [phone], Monday-Friday, 8am-4pm. If you experience any cold or flu symptoms such as cough, fever, chills, shortness of breath, etc. between now and your scheduled surgery, please notify us  at the above number.     Remember:  Do not eat after midnight the night before your surgery  You may drink clear liquids until 9:26 the morning of your surgery.   Clear liquids allowed are: Water , Non-Citrus Juices (without pulp), Carbonated Beverages, Clear Tea (no milk, honey, etc.), Black Coffee Only (NO MILK, CREAM OR POWDERED CREAMER of any kind), and Gatorade.    Take these medicines the morning of surgery with A SIP OF WATER   amLODipine  (NORVASC )  atorvastatin  (LIPITOR)  DULoxetine  (CYMBALTA )  metoprolol  succinate (TOPROL -XL)  pregabalin  (LYRICA )   Per your cardiologist instructions, hold your Eliquis  for 3 days prior to surgery.  Your last dose of Eliquis  should be 07/25/2023.   One week prior to surgery, STOP taking any Aspirin (unless otherwise instructed by your surgeon) Aleve, Naproxen, Ibuprofen, Motrin, Advil, Goody's, BC's, all herbal medications, fish oil, and non-prescription vitamins.                     Do NOT Smoke (Tobacco/Vaping) for 24 hours prior to your procedure.  If you use a CPAP at night, you may bring your mask/headgear for your overnight stay.   You will be asked to remove any contacts, glasses, piercing's, hearing aid's, dentures/partials prior to surgery. Please bring cases for these items if needed.    Patients discharged the day of surgery will not be allowed to drive home, and someone needs to stay with them for 24 hours.  SURGICAL WAITING ROOM VISITATION Patients may have no more  than 2 support people in the waiting area - these visitors may rotate.   Pre-op nurse will coordinate an appropriate time for 1 ADULT support person, who may not rotate, to accompany patient in pre-op.  Children under the age of 55 must have an adult with them who is not the patient and must remain in the main waiting area with an adult.  If the patient needs to stay at the hospital during part of their recovery, the visitor guidelines for inpatient rooms apply.  Please refer to the [facility] website for the visitor guidelines for any additional information.   If you received a COVID test during your pre-op visit  it is requested that you wear a mask when out in public, stay away from anyone that may not be feeling well and notify your surgeon if you develop symptoms. If you have been in contact with anyone that has tested positive in the last 10 days please notify you surgeon.      Pre-operative 5 CHG Bathing Instructions   You can play a key role in reducing the risk of infection after surgery. Your skin needs to be as free of germs as possible. You can reduce the number of germs on your skin by washing with CHG (chlorhexidine  gluconate) soap before surgery. CHG is an antiseptic soap that kills germs and continues to kill germs even after washing.   DO NOT use if you have an allergy to chlorhexidine /CHG or antibacterial soaps. If your skin becomes reddened or  irritated, stop using the CHG and notify one of our RNs at [phone].   Please shower with the CHG soap starting 4 days before surgery using the following schedule:     Please keep in mind the following:  DO NOT shave, including legs and underarms, starting the day of your first shower.   You may shave your face at any point before/day of surgery.  Place clean sheets on your bed the day you start using CHG soap. Use a clean washcloth (not used since being washed) for each shower. DO NOT sleep with pets once you start using  the CHG.   CHG Shower Instructions:  Wash your face and private area with normal soap. If you choose to wash your hair, wash first with your normal shampoo.  After you use shampoo/soap, rinse your hair and body thoroughly to remove shampoo/soap residue.  Turn the water  OFF and apply about 3 tablespoons (45 ml) of CHG soap to a CLEAN washcloth.  Apply CHG soap ONLY FROM YOUR NECK DOWN TO YOUR TOES (washing for 3-5 minutes)  DO NOT use CHG soap on face, private areas, open wounds, or sores.  Pay special attention to the area where your surgery is being performed.  If you are having back surgery, having someone wash your back for you may be helpful. Wait 2 minutes after CHG soap is applied, then you may rinse off the CHG soap.  Pat dry with a clean towel  Put on clean clothes/pajamas   If you choose to wear lotion, please use ONLY the CHG-compatible lotions that are listed below.  Additional instructions for the day of surgery: DO NOT APPLY any lotions, deodorants, cologne, or perfumes.   Do not bring valuables to the hospital. [facility] is not responsible for any belongings/valuables. Do not wear nail polish, gel polish, artificial nails, or any other type of covering on natural nails (fingers and toes) Do not wear jewelry or makeup Put on clean/comfortable clothes.  Please brush your teeth.  Ask your nurse before applying any prescription medications to the skin.     CHG Compatible Lotions   Aveeno Moisturizing lotion  Cetaphil Moisturizing Cream  Cetaphil Moisturizing Lotion  Clairol Herbal Essence Moisturizing Lotion, Dry Skin  Clairol Herbal Essence Moisturizing Lotion, Extra Dry Skin  Clairol Herbal Essence Moisturizing Lotion, Normal Skin  Curel Age Defying Therapeutic Moisturizing Lotion with Alpha Hydroxy  Curel Extreme Care Body Lotion  Curel Soothing Hands Moisturizing Hand Lotion  Curel Therapeutic Moisturizing Cream, Fragrance-Free  Curel Therapeutic Moisturizing  Lotion, Fragrance-Free  Curel Therapeutic Moisturizing Lotion, Original Formula  Eucerin Daily Replenishing Lotion  Eucerin Dry Skin Therapy Plus Alpha Hydroxy Crme  Eucerin Dry Skin Therapy Plus Alpha Hydroxy Lotion  Eucerin Original Crme  Eucerin Original Lotion  Eucerin Plus Crme Eucerin Plus Lotion  Eucerin TriLipid Replenishing Lotion  Keri Anti-Bacterial Hand Lotion  Keri Deep Conditioning Original Lotion Dry Skin Formula Softly Scented  Keri Deep Conditioning Original Lotion, Fragrance Free Sensitive Skin Formula  Keri Lotion Fast Absorbing Fragrance Free Sensitive Skin Formula  Keri Lotion Fast Absorbing Softly Scented Dry Skin Formula  Keri Original Lotion  Keri Skin Renewal Lotion Keri Silky Smooth Lotion  Keri Silky Smooth Sensitive Skin Lotion  Nivea Body Creamy Conditioning Oil  Nivea Body Extra Enriched Teacher, Adult Education Moisturizing Lotion Nivea Crme  Nivea Skin Firming Lotion  NutraDerm 30 Skin Lotion  NutraDerm Skin Lotion  NutraDerm Therapeutic Skin Cream  NutraDerm  Therapeutic Skin Lotion  ProShield Protective Hand Cream  Provon moisturizing lotion  Please read over the following fact sheets that you were given.

## 2023-07-27 ENCOUNTER — Other Ambulatory Visit: Payer: Self-pay

## 2023-07-27 ENCOUNTER — Encounter (HOSPITAL_COMMUNITY): Payer: Self-pay

## 2023-07-27 ENCOUNTER — Encounter (HOSPITAL_COMMUNITY)
Admission: RE | Admit: 2023-07-27 | Discharge: 2023-07-27 | Disposition: A | Discharge status: Home / Self Care | Payer: Medicare Other | Source: Ambulatory Visit | Attending: Neurosurgery | Admitting: Neurosurgery

## 2023-07-27 VITALS — BP 119/76 | HR 70 | Temp 97.5°F | Resp 18 | Ht 72.0 in | Wt 250.9 lb

## 2023-07-27 DIAGNOSIS — G4733 Obstructive sleep apnea (adult) (pediatric): Secondary | ICD-10-CM | POA: Insufficient documentation

## 2023-07-27 DIAGNOSIS — Z01812 Encounter for preprocedural laboratory examination: Secondary | ICD-10-CM | POA: Diagnosis present

## 2023-07-27 DIAGNOSIS — Z01818 Encounter for other preprocedural examination: Secondary | ICD-10-CM

## 2023-07-27 DIAGNOSIS — I10 Essential (primary) hypertension: Secondary | ICD-10-CM | POA: Diagnosis not present

## 2023-07-27 HISTORY — DX: Depression, unspecified: F32.A

## 2023-07-27 LAB — CBC
HCT: 44.7 % (ref 39.0–52.0)
Hemoglobin: 15.5 g/dL (ref 13.0–17.0)
MCH: 32.8 pg (ref 26.0–34.0)
MCHC: 34.7 g/dL (ref 30.0–36.0)
MCV: 94.5 fL (ref 80.0–100.0)
Platelets: 264 10*3/uL (ref 150–400)
RBC: 4.73 MIL/uL (ref 4.22–5.81)
RDW: 12 % (ref 11.5–15.5)
WBC: 6.2 10*3/uL (ref 4.0–10.5)
nRBC: 0 % (ref 0.0–0.2)

## 2023-07-27 LAB — BASIC METABOLIC PANEL
Anion gap: 7 (ref 5–15)
BUN: 17 mg/dL (ref 8–23)
CO2: 28 mmol/L (ref 22–32)
Calcium: 9 mg/dL (ref 8.9–10.3)
Chloride: 101 mmol/L (ref 98–111)
Creatinine, Ser: 0.91 mg/dL (ref 0.61–1.24)
GFR, Estimated: >60 mL/min (ref >60)
Glucose, Bld: 146 mg/dL — ABNORMAL HIGH (ref 70–99)
Potassium: 3.7 mmol/L (ref 3.5–5.1)
Sodium: 136 mmol/L (ref 135–145)

## 2023-07-27 LAB — SURGICAL PCR SCREEN
MRSA, PCR: NEGATIVE
Staphylococcus aureus: POSITIVE — AB

## 2023-07-27 NOTE — Progress Notes (Signed)
 PCP -  Claudene Pellet, MD  Cardiologist - Mealor, Eulas BRAVO, MD   PPM/ICD -  denies Device Orders - n/a Rep Notified - n/a  Chest x-ray - denies EKG - 05-11-23 Stress Test - 2009-2010 ECHO - denies Cardiac Cath - denies  Sleep Study -  CPAP - uses a mouth piece per patient  DM - Denies    Blood Thinner Instructions:Eliquis  Last dose 07-24-23 Aspirin Instructions: n/a  ERAS Protcol - clear liquids until 9:25 PRE-SURGERY Ensure or G2-   COVID TEST- n/a   Anesthesia review: yes, hx of Htn, OSA, A fib  Patient denies shortness of breath, fever, cough and chest pain at PAT appointment   All instructions explained to the patient, with a verbal understanding of the material. Patient agrees to go over the instructions while at home for a better understanding. Patient also instructed to self quarantine after being tested for COVID-19. The opportunity to ask questions was provided.

## 2023-07-28 ENCOUNTER — Other Ambulatory Visit: Payer: Self-pay | Admitting: Neurosurgery

## 2023-07-28 NOTE — Anesthesia Preprocedure Evaluation (Addendum)
 Anesthesia Evaluation  Patient identified by MRN, date of birth, ID band Patient awake    Reviewed: Allergy & Precautions, NPO status , Patient's Chart, lab work & pertinent test results, reviewed documented beta blocker date and time   Airway Mallampati: II  TM Distance: >3 FB Neck ROM: Full    Dental no notable dental hx. (+) Teeth Intact, Dental Advisory Given   Pulmonary sleep apnea and Continuous Positive Airway Pressure Ventilation    Pulmonary exam normal breath sounds clear to auscultation       Cardiovascular hypertension, Pt. on medications and Pt. on home beta blockers Normal cardiovascular exam+ dysrhythmias Atrial Fibrillation  Rhythm:Regular Rate:Normal     Neuro/Psych  PSYCHIATRIC DISORDERS Anxiety Depression    Neurogenic claudication    GI/Hepatic negative GI ROS, Neg liver ROS,,,  Endo/Other  negative endocrine ROS    Renal/GU negative Renal ROS     Musculoskeletal negative musculoskeletal ROS (+)    Abdominal   Peds  Hematology  (+) Blood dyscrasia (Eliquis ) Obesity    Anesthesia Other Findings   Reproductive/Obstetrics                              Anesthesia Physical Anesthesia Plan  ASA: 3  Anesthesia Plan: General   Post-op Pain Management: Tylenol  PO (pre-op)*   Induction: Intravenous  PONV Risk Score and Plan: 2 and Midazolam , Dexamethasone  and Ondansetron   Airway Management Planned: Oral ETT  Additional Equipment:   Intra-op Plan:   Post-operative Plan: Extubation in OR  Informed Consent: I have reviewed the patients History and Physical, chart, labs and discussed the procedure including the risks, benefits and alternatives for the proposed anesthesia with the patient or authorized representative who has indicated his/her understanding and acceptance.     Dental advisory given  Plan Discussed with: CRNA  Anesthesia Plan Comments: (PAT note by  Lynwood Hope, PA-C: 68 year old male follows with cardiology for history of A-fib s/p ablation x 2 (2011 and 08/2022) on Eliquis , hypertension, and OSA on CPAP.  Per notes, he had a remote echo and Cardiolite that were normal.  Seen via televisit by Rosaline Bane, NP 07/17/2023 for preop evaluation.  Per note, Chart reviewed as part of pre-operative protocol coverage. Given past medical history and time since last visit, based on ACC/AHA guidelines, Nixon O Klinedinst would be at acceptable risk for the planned procedure without further cardiovascular testing. I contacted patient to ensure no changes in medical history and no new symptoms since virtual visit on 06/22/23. Patient was advised that if he develops new symptoms prior to surgery to contact our office to arrange a follow-up appointment.  He verbalized understanding. Per office protocol, patient can hold Eliquis  for 3 days prior to procedure.  Patient reports last dose of Eliquis  07/24/2023.  Preop labs reviewed, unremarkable.  EKG 05/11/2023: Sinus rhythm with PVCs or PACs with aberrant conduction  )        Anesthesia Quick Evaluation

## 2023-07-28 NOTE — Progress Notes (Signed)
 Anesthesia Chart Review:  68 year old male follows with cardiology for history of A-fib s/p ablation x 2 (2011 and 08/2022) on Eliquis , hypertension, and OSA on CPAP.  Per notes, he had a remote echo and Cardiolite that were normal.  Seen via televisit by Rosaline Bane, NP 07/17/2023 for preop evaluation.  Per note, Chart reviewed as part of pre-operative protocol coverage. Given past medical history and time since last visit, based on ACC/AHA guidelines, [name] would be at acceptable risk for the planned procedure without further cardiovascular testing. I contacted patient to ensure no changes in medical history and no new symptoms since virtual visit on 06/22/23. Patient was advised that if he develops new symptoms prior to surgery to contact our office to arrange a follow-up appointment.  He verbalized understanding. Per office protocol, patient can hold Eliquis  for 3 days prior to procedure.  Patient reports last dose of Eliquis  07/24/2023.  Preop labs reviewed, unremarkable.  EKG 05/11/2023: Sinus rhythm with PVCs or PACs with aberrant conduction    [name] [facility] Short Stay Center/Anesthesiology Phone [phone] 07/28/2023 9:18 AM]

## 2023-07-29 ENCOUNTER — Ambulatory Visit (HOSPITAL_COMMUNITY): Payer: Medicare Other | Admitting: Physician Assistant

## 2023-07-29 ENCOUNTER — Ambulatory Visit (HOSPITAL_COMMUNITY): Payer: Medicare Other

## 2023-07-29 ENCOUNTER — Ambulatory Visit (HOSPITAL_COMMUNITY)
Admission: RE | Disposition: A | Discharge status: Home / Self Care | Payer: Self-pay | Source: Home / Self Care | Attending: Neurosurgery

## 2023-07-29 ENCOUNTER — Other Ambulatory Visit: Payer: Self-pay

## 2023-07-29 ENCOUNTER — Encounter (HOSPITAL_COMMUNITY): Payer: Self-pay | Admitting: Neurosurgery

## 2023-07-29 ENCOUNTER — Ambulatory Visit (HOSPITAL_BASED_OUTPATIENT_CLINIC_OR_DEPARTMENT_OTHER): Payer: Medicare Other | Admitting: Certified Registered Nurse Anesthetist

## 2023-07-29 ENCOUNTER — Ambulatory Visit (HOSPITAL_COMMUNITY)
Admission: RE | Admit: 2023-07-29 | Discharge: 2023-07-30 | Disposition: A | Discharge status: Home / Self Care | Payer: Medicare Other | Attending: Neurosurgery | Admitting: Neurosurgery

## 2023-07-29 DIAGNOSIS — G4733 Obstructive sleep apnea (adult) (pediatric): Secondary | ICD-10-CM | POA: Insufficient documentation

## 2023-07-29 DIAGNOSIS — M48062 Spinal stenosis, lumbar region with neurogenic claudication: Secondary | ICD-10-CM

## 2023-07-29 DIAGNOSIS — Z6833 Body mass index (BMI) 33.0-33.9, adult: Secondary | ICD-10-CM | POA: Insufficient documentation

## 2023-07-29 DIAGNOSIS — Z7901 Long term (current) use of anticoagulants: Secondary | ICD-10-CM | POA: Diagnosis not present

## 2023-07-29 DIAGNOSIS — E669 Obesity, unspecified: Secondary | ICD-10-CM | POA: Diagnosis not present

## 2023-07-29 DIAGNOSIS — F32A Depression, unspecified: Secondary | ICD-10-CM | POA: Insufficient documentation

## 2023-07-29 DIAGNOSIS — I4891 Unspecified atrial fibrillation: Secondary | ICD-10-CM | POA: Insufficient documentation

## 2023-07-29 DIAGNOSIS — M5416 Radiculopathy, lumbar region: Secondary | ICD-10-CM | POA: Diagnosis not present

## 2023-07-29 DIAGNOSIS — F419 Anxiety disorder, unspecified: Secondary | ICD-10-CM | POA: Diagnosis not present

## 2023-07-29 DIAGNOSIS — F418 Other specified anxiety disorders: Secondary | ICD-10-CM | POA: Diagnosis not present

## 2023-07-29 DIAGNOSIS — I10 Essential (primary) hypertension: Secondary | ICD-10-CM

## 2023-07-29 DIAGNOSIS — Z79899 Other long term (current) drug therapy: Secondary | ICD-10-CM | POA: Insufficient documentation

## 2023-07-29 DIAGNOSIS — M48061 Spinal stenosis, lumbar region without neurogenic claudication: Secondary | ICD-10-CM | POA: Diagnosis present

## 2023-07-29 HISTORY — PX: LUMBAR LAMINECTOMY/DECOMPRESSION MICRODISCECTOMY: SHX5026

## 2023-07-29 SURGERY — LUMBAR LAMINECTOMY/DECOMPRESSION MICRODISCECTOMY 1 LEVEL
Anesthesia: General | Site: Back | Laterality: Right

## 2023-07-29 MED ORDER — PANTOPRAZOLE SODIUM 40 MG IV SOLR: 40.0000 mg | Freq: Every day | INTRAVENOUS | Status: DC

## 2023-07-29 MED ORDER — SUGAMMADEX SODIUM 200 MG/2ML IV SOLN
INTRAVENOUS | Status: DC | PRN
  Administered 2023-07-29: 226.8 mg via INTRAVENOUS

## 2023-07-29 MED ORDER — ACETAMINOPHEN 10 MG/ML IV SOLN
INTRAVENOUS | Status: DC | PRN
  Administered 2023-07-29: 1000 mg via INTRAVENOUS

## 2023-07-29 MED ORDER — ROCURONIUM BROMIDE 100 MG/10ML IV SOLN
INTRAVENOUS | Status: DC | PRN
  Administered 2023-07-29: 80 mg via INTRAVENOUS
  Administered 2023-07-29: 10 mg via INTRAVENOUS

## 2023-07-29 MED ORDER — ONDANSETRON HCL 4 MG/2ML IJ SOLN
4.0000 mg | Freq: Four times a day (QID) | INTRAMUSCULAR | Status: DC | PRN
Start: 2023-07-29 — End: 2023-07-30

## 2023-07-29 MED ORDER — LIDOCAINE-EPINEPHRINE 1 %-1:100000 IJ SOLN
INTRAMUSCULAR | Status: DC | PRN
  Administered 2023-07-29: 10 mL

## 2023-07-29 MED ORDER — ONDANSETRON HCL 4 MG/2ML IJ SOLN
INTRAMUSCULAR | Status: AC
  Filled 2023-07-29: qty 2

## 2023-07-29 MED ORDER — FENTANYL CITRATE (PF) 250 MCG/5ML IJ SOLN
INTRAMUSCULAR | Status: DC | PRN
  Administered 2023-07-29 (×2): 50 ug via INTRAVENOUS
  Administered 2023-07-29: 150 ug via INTRAVENOUS

## 2023-07-29 MED ORDER — PHENYLEPHRINE 80 MCG/ML (10ML) SYRINGE FOR IV PUSH (FOR BLOOD PRESSURE SUPPORT)
PREFILLED_SYRINGE | INTRAVENOUS | Status: DC | PRN
  Administered 2023-07-29 (×9): 80 ug via INTRAVENOUS

## 2023-07-29 MED ORDER — SODIUM CHLORIDE 0.9% FLUSH: 3.0000 mL | INTRAVENOUS | Status: DC | PRN

## 2023-07-29 MED ORDER — MIDAZOLAM HCL 2 MG/2ML IJ SOLN
INTRAMUSCULAR | Status: DC | PRN
  Administered 2023-07-29: 2 mg via INTRAVENOUS

## 2023-07-29 MED ORDER — LACTATED RINGERS IV SOLN: INTRAVENOUS | Status: DC | PRN

## 2023-07-29 MED ORDER — SODIUM CHLORIDE 0.9 % IV SOLN
250.0000 mL | INTRAVENOUS | Status: DC
  Administered 2023-07-29: 250 mL via INTRAVENOUS

## 2023-07-29 MED ORDER — LIDOCAINE 2% (20 MG/ML) 5 ML SYRINGE
INTRAMUSCULAR | Status: AC
  Filled 2023-07-29: qty 5

## 2023-07-29 MED ORDER — GINKGO BILOBA 40 MG PO CAPS: ORAL_CAPSULE | Freq: Every day | ORAL | Status: DC

## 2023-07-29 MED ORDER — HYDROCODONE-ACETAMINOPHEN 5-325 MG PO TABS
2.0000 | ORAL_TABLET | ORAL | Status: DC | PRN
  Administered 2023-07-29: 2 via ORAL
  Filled 2023-07-29: qty 2

## 2023-07-29 MED ORDER — PANTOPRAZOLE SODIUM 40 MG PO TBEC
40.0000 mg | DELAYED_RELEASE_TABLET | Freq: Every day | ORAL | Status: DC
  Filled 2023-07-29: qty 1

## 2023-07-29 MED ORDER — PROPOFOL 10 MG/ML IV BOLUS
INTRAVENOUS | Status: DC | PRN
  Administered 2023-07-29: 150 mg via INTRAVENOUS

## 2023-07-29 MED ORDER — DULOXETINE HCL 30 MG PO CPEP: 60.0000 mg | ORAL_CAPSULE | Freq: Every day | ORAL | Status: DC

## 2023-07-29 MED ORDER — HYDROMORPHONE HCL 1 MG/ML IJ SOLN: 0.5000 mg | INTRAMUSCULAR | Status: DC | PRN

## 2023-07-29 MED ORDER — CHLORHEXIDINE GLUCONATE CLOTH 2 % EX PADS: 6.0000 | MEDICATED_PAD | Freq: Once | CUTANEOUS | Status: DC

## 2023-07-29 MED ORDER — FENTANYL CITRATE (PF) 250 MCG/5ML IJ SOLN
INTRAMUSCULAR | Status: AC
  Filled 2023-07-29: qty 5

## 2023-07-29 MED ORDER — ACETAMINOPHEN 10 MG/ML IV SOLN: 1000.0000 mg | Freq: Once | INTRAVENOUS | Status: DC | PRN

## 2023-07-29 MED ORDER — ALUM & MAG HYDROXIDE-SIMETH 200-200-20 MG/5ML PO SUSP: 30.0000 mL | Freq: Four times a day (QID) | ORAL | Status: DC | PRN

## 2023-07-29 MED ORDER — DEXAMETHASONE SODIUM PHOSPHATE 10 MG/ML IJ SOLN
INTRAMUSCULAR | Status: AC
  Filled 2023-07-29: qty 1

## 2023-07-29 MED ORDER — PROPOFOL 10 MG/ML IV BOLUS
INTRAVENOUS | Status: AC
  Filled 2023-07-29: qty 20

## 2023-07-29 MED ORDER — ACETAMINOPHEN 10 MG/ML IV SOLN
INTRAVENOUS | Status: AC
  Filled 2023-07-29: qty 100

## 2023-07-29 MED ORDER — PHENYLEPHRINE HCL-NACL 20-0.9 MG/250ML-% IV SOLN
INTRAVENOUS | Status: DC | PRN
  Administered 2023-07-29: 25 ug/min via INTRAVENOUS

## 2023-07-29 MED ORDER — ACETAMINOPHEN 650 MG RE SUPP: 650.0000 mg | RECTAL | Status: DC | PRN

## 2023-07-29 MED ORDER — METOPROLOL SUCCINATE ER 25 MG PO TB24: 25.0000 mg | ORAL_TABLET | Freq: Every day | ORAL | Status: DC

## 2023-07-29 MED ORDER — LIDOCAINE-EPINEPHRINE 1 %-1:100000 IJ SOLN
INTRAMUSCULAR | Status: AC
  Filled 2023-07-29: qty 1

## 2023-07-29 MED ORDER — ROCURONIUM BROMIDE 10 MG/ML (PF) SYRINGE
PREFILLED_SYRINGE | INTRAVENOUS | Status: AC
  Filled 2023-07-29: qty 10

## 2023-07-29 MED ORDER — SODIUM CHLORIDE 0.9% FLUSH
3.0000 mL | Freq: Two times a day (BID) | INTRAVENOUS | Status: DC
  Administered 2023-07-29: 3 mL via INTRAVENOUS

## 2023-07-29 MED ORDER — ONDANSETRON HCL 4 MG/2ML IJ SOLN
INTRAMUSCULAR | Status: DC | PRN
  Administered 2023-07-29: 4 mg via INTRAVENOUS

## 2023-07-29 MED ORDER — THROMBIN 5000 UNITS EX SOLR
CUTANEOUS | Status: AC
Start: 2023-07-29 — End: ?
  Filled 2023-07-29: qty 10000

## 2023-07-29 MED ORDER — CO Q-10 200 MG PO CAPS: 1.0000 | ORAL_CAPSULE | Freq: Every morning | ORAL | Status: DC

## 2023-07-29 MED ORDER — ONDANSETRON HCL 4 MG PO TABS: 4.0000 mg | ORAL_TABLET | Freq: Four times a day (QID) | ORAL | Status: DC | PRN

## 2023-07-29 MED ORDER — PHENYLEPHRINE 80 MCG/ML (10ML) SYRINGE FOR IV PUSH (FOR BLOOD PRESSURE SUPPORT)
PREFILLED_SYRINGE | INTRAVENOUS | Status: AC
Start: 2023-07-29 — End: ?
  Filled 2023-07-29: qty 10

## 2023-07-29 MED ORDER — LISINOPRIL 20 MG PO TABS
20.0000 mg | ORAL_TABLET | Freq: Every day | ORAL | Status: DC
  Administered 2023-07-29: 20 mg via ORAL
  Filled 2023-07-29: qty 1

## 2023-07-29 MED ORDER — CEFAZOLIN SODIUM-DEXTROSE 2-4 GM/100ML-% IV SOLN
2.0000 g | INTRAVENOUS | Status: AC
  Administered 2023-07-29: 2 g via INTRAVENOUS
  Filled 2023-07-29: qty 100

## 2023-07-29 MED ORDER — OXYCODONE HCL 5 MG/5ML PO SOLN
5.0000 mg | Freq: Once | ORAL | Status: DC | PRN
Start: 2023-07-29 — End: 2023-07-29

## 2023-07-29 MED ORDER — ACETAMINOPHEN 325 MG PO TABS: 650.0000 mg | ORAL_TABLET | ORAL | Status: DC | PRN

## 2023-07-29 MED ORDER — CEFAZOLIN SODIUM-DEXTROSE 2-4 GM/100ML-% IV SOLN
2.0000 g | Freq: Three times a day (TID) | INTRAVENOUS | Status: AC
  Administered 2023-07-29 – 2023-07-30 (×2): 2 g via INTRAVENOUS
  Filled 2023-07-29 (×2): qty 100

## 2023-07-29 MED ORDER — SENNA 8.6 MG PO TABS
1.0000 | ORAL_TABLET | Freq: Two times a day (BID) | ORAL | Status: DC | PRN
  Administered 2023-07-29: 8.6 mg via ORAL
  Filled 2023-07-29: qty 1

## 2023-07-29 MED ORDER — DIPHENHYDRAMINE-APAP (SLEEP) 25-500 MG PO TABS: 2.0000 | ORAL_TABLET | Freq: Every day | ORAL | Status: DC

## 2023-07-29 MED ORDER — MIDAZOLAM HCL 2 MG/2ML IJ SOLN
INTRAMUSCULAR | Status: AC
  Filled 2023-07-29: qty 2

## 2023-07-29 MED ORDER — ORAL CARE MOUTH RINSE: 15.0000 mL | Freq: Once | OROMUCOSAL | Status: AC

## 2023-07-29 MED ORDER — AMLODIPINE BESYLATE 10 MG PO TABS: 10.0000 mg | ORAL_TABLET | Freq: Every day | ORAL | Status: DC

## 2023-07-29 MED ORDER — CHLORHEXIDINE GLUCONATE 4 % EX SOLN: 1.0000 | CUTANEOUS | 1 refills | Status: DC

## 2023-07-29 MED ORDER — LIDOCAINE 2% (20 MG/ML) 5 ML SYRINGE
INTRAMUSCULAR | Status: DC | PRN
  Administered 2023-07-29: 100 mg via INTRAVENOUS

## 2023-07-29 MED ORDER — MENTHOL 3 MG MT LOZG
1.0000 | LOZENGE | OROMUCOSAL | Status: DC | PRN
Start: 2023-07-29 — End: 2023-07-30

## 2023-07-29 MED ORDER — DOCUSATE SODIUM 100 MG PO CAPS
100.0000 mg | ORAL_CAPSULE | Freq: Two times a day (BID) | ORAL | Status: DC | PRN
  Administered 2023-07-29: 100 mg via ORAL
  Filled 2023-07-29: qty 1

## 2023-07-29 MED ORDER — PHENOL 1.4 % MT LIQD: 1.0000 | OROMUCOSAL | Status: DC | PRN

## 2023-07-29 MED ORDER — DEXAMETHASONE SODIUM PHOSPHATE 10 MG/ML IJ SOLN
INTRAMUSCULAR | Status: DC | PRN
  Administered 2023-07-29: 10 mg via INTRAVENOUS

## 2023-07-29 MED ORDER — MELATONIN 3 MG PO TABS
9.0000 mg | ORAL_TABLET | Freq: Every day | ORAL | Status: DC
  Administered 2023-07-29: 9 mg via ORAL
  Filled 2023-07-29: qty 3

## 2023-07-29 MED ORDER — MUPIROCIN 2 % EX OINT: 1.0000 | TOPICAL_OINTMENT | Freq: Two times a day (BID) | CUTANEOUS | 0 refills | Status: AC

## 2023-07-29 MED ORDER — 0.9 % SODIUM CHLORIDE (POUR BTL) OPTIME
TOPICAL | Status: DC | PRN
  Administered 2023-07-29: 1000 mL

## 2023-07-29 MED ORDER — PREGABALIN 75 MG PO CAPS
150.0000 mg | ORAL_CAPSULE | Freq: Two times a day (BID) | ORAL | Status: DC
  Administered 2023-07-29: 150 mg via ORAL
  Filled 2023-07-29: qty 2

## 2023-07-29 MED ORDER — DROPERIDOL 2.5 MG/ML IJ SOLN: 0.6250 mg | Freq: Once | INTRAMUSCULAR | Status: DC | PRN

## 2023-07-29 MED ORDER — ATORVASTATIN CALCIUM 10 MG PO TABS
20.0000 mg | ORAL_TABLET | Freq: Every day | ORAL | Status: DC
Start: 2023-07-30 — End: 2023-07-30

## 2023-07-29 MED ORDER — STERILE WATER FOR IRRIGATION IR SOLN
Status: DC | PRN
  Administered 2023-07-29: 1000 mL

## 2023-07-29 MED ORDER — OXYCODONE HCL 5 MG PO TABS: 5.0000 mg | ORAL_TABLET | Freq: Once | ORAL | Status: DC | PRN

## 2023-07-29 MED ORDER — CYCLOBENZAPRINE HCL 10 MG PO TABS
10.0000 mg | ORAL_TABLET | Freq: Three times a day (TID) | ORAL | Status: DC | PRN
  Administered 2023-07-29: 10 mg via ORAL
  Filled 2023-07-29: qty 1

## 2023-07-29 MED ORDER — BUPIVACAINE HCL (PF) 0.25 % IJ SOLN
INTRAMUSCULAR | Status: AC
  Filled 2023-07-29: qty 30

## 2023-07-29 MED ORDER — BUPIVACAINE HCL (PF) 0.25 % IJ SOLN
INTRAMUSCULAR | Status: DC | PRN
  Administered 2023-07-29: 10 mL

## 2023-07-29 MED ORDER — HYDROCODONE-ACETAMINOPHEN 5-325 MG PO TABS
1.0000 | ORAL_TABLET | ORAL | Status: DC | PRN
  Administered 2023-07-29 – 2023-07-30 (×3): 2 via ORAL
  Filled 2023-07-29 (×3): qty 2

## 2023-07-29 MED ORDER — VITAMIN C 500 MG PO TABS
500.0000 mg | ORAL_TABLET | Freq: Every day | ORAL | Status: DC
  Administered 2023-07-29: 500 mg via ORAL
  Filled 2023-07-29: qty 1

## 2023-07-29 MED ORDER — THROMBIN (RECOMBINANT) 5000 UNITS EX SOLR
CUTANEOUS | Status: DC | PRN
  Administered 2023-07-29: 10 mL via TOPICAL

## 2023-07-29 MED ORDER — CHLORHEXIDINE GLUCONATE 0.12 % MT SOLN
15.0000 mL | Freq: Once | OROMUCOSAL | Status: AC
Start: 2023-07-29 — End: 2023-07-29
  Administered 2023-07-29: 15 mL via OROMUCOSAL
  Filled 2023-07-29: qty 15

## 2023-07-29 MED ORDER — FENTANYL CITRATE (PF) 100 MCG/2ML IJ SOLN: 25.0000 ug | INTRAMUSCULAR | Status: DC | PRN

## 2023-07-29 SURGICAL SUPPLY — 46 items
BAG COUNTER SPONGE SURGICOUNT (BAG) ×2 IMPLANT
BENZOIN TINCTURE PRP APPL 2/3 (GAUZE/BANDAGES/DRESSINGS) ×2 IMPLANT
BLADE CLIPPER SURG (BLADE) IMPLANT
BLADE SURG 11 STRL SS (BLADE) ×2 IMPLANT
BUR CUTTER 7.0 ROUND (BURR) ×2 IMPLANT
BUR MATCHSTICK NEURO 3.0 LAGG (BURR) ×2 IMPLANT
CANISTER SUCT 3000ML PPV (MISCELLANEOUS) ×2 IMPLANT
CLSR STERI-STRIP ANTIMIC 1/2X4 (GAUZE/BANDAGES/DRESSINGS) IMPLANT
DERMABOND ADVANCED .7 DNX12 (GAUZE/BANDAGES/DRESSINGS) ×2 IMPLANT
DRAPE HALF SHEET 40X57 (DRAPES) IMPLANT
DRAPE LAPAROTOMY 100X72X124 (DRAPES) ×2 IMPLANT
DRAPE MICROSCOPE SLANT 54X150 (MISCELLANEOUS) ×2 IMPLANT
DRAPE SURG 17X23 STRL (DRAPES) ×2 IMPLANT
DRSG OPSITE POSTOP 4X6 (GAUZE/BANDAGES/DRESSINGS) IMPLANT
DURAPREP 26ML APPLICATOR (WOUND CARE) ×2 IMPLANT
ELECT BLADE 4.0 EZ CLEAN MEGAD (MISCELLANEOUS) ×1
ELECT REM PT RETURN 9FT ADLT (ELECTROSURGICAL) ×1
ELECTRODE BLDE 4.0 EZ CLN MEGD (MISCELLANEOUS) IMPLANT
ELECTRODE REM PT RTRN 9FT ADLT (ELECTROSURGICAL) ×2 IMPLANT
GAUZE 4X4 16PLY ~~LOC~~+RFID DBL (SPONGE) IMPLANT
GAUZE SPONGE 4X4 12PLY STRL (GAUZE/BANDAGES/DRESSINGS) ×2 IMPLANT
GLOVE BIO SURGEON STRL SZ7 (GLOVE) IMPLANT
GLOVE BIO SURGEON STRL SZ8 (GLOVE) ×2 IMPLANT
GLOVE BIOGEL PI IND STRL 7.0 (GLOVE) IMPLANT
GLOVE EXAM NITRILE XL STR (GLOVE) IMPLANT
GLOVE INDICATOR 8.5 STRL (GLOVE) ×4 IMPLANT
GOWN STRL REUS W/ TWL LRG LVL3 (GOWN DISPOSABLE) ×2 IMPLANT
GOWN STRL REUS W/ TWL XL LVL3 (GOWN DISPOSABLE) ×4 IMPLANT
GOWN STRL REUS W/TWL 2XL LVL3 (GOWN DISPOSABLE) IMPLANT
KIT BASIN OR (CUSTOM PROCEDURE TRAY) ×2 IMPLANT
KIT TURNOVER KIT B (KITS) ×2 IMPLANT
NDL HYPO 22X1.5 SAFETY MO (MISCELLANEOUS) ×2 IMPLANT
NDL SPNL 22GX3.5 QUINCKE BK (NEEDLE) ×2 IMPLANT
NEEDLE HYPO 22X1.5 SAFETY MO (MISCELLANEOUS) ×1
NEEDLE SPNL 22GX3.5 QUINCKE BK (NEEDLE) ×1
NS IRRIG 1000ML POUR BTL (IV SOLUTION) ×2 IMPLANT
PACK LAMINECTOMY NEURO (CUSTOM PROCEDURE TRAY) ×2 IMPLANT
SPIKE FLUID TRANSFER (MISCELLANEOUS) ×2 IMPLANT
SPONGE SURGIFOAM ABS GEL SZ50 (HEMOSTASIS) ×2 IMPLANT
STRIP CLOSURE SKIN 1/2X4 (GAUZE/BANDAGES/DRESSINGS) ×2 IMPLANT
SUT VIC AB 0 CT1 18XCR BRD8 (SUTURE) ×2 IMPLANT
SUT VIC AB 2-0 CT1 18 (SUTURE) ×2 IMPLANT
SUT VICRYL 4-0 PS2 18IN ABS (SUTURE) ×2 IMPLANT
TOWEL GREEN STERILE (TOWEL DISPOSABLE) ×2 IMPLANT
TOWEL GREEN STERILE FF (TOWEL DISPOSABLE) ×2 IMPLANT
WATER STERILE IRR 1000ML POUR (IV SOLUTION) ×2 IMPLANT

## 2023-07-29 NOTE — Progress Notes (Signed)
 Patient has listed allergy to Penicillin and Amoxicillin. Per Dr. Lamon Pillow ok to give Ancef  today.

## 2023-07-29 NOTE — H&P (Signed)
 [name] is an 68 y.o. male.   Chief Complaint: Back bilateral hip and leg pain difficulty walking HPI: 68 year old gentleman progressive worsening back bilateral hip and leg pain difficulty walking workup revealed severe spinal stenosis at L2-3 with possible discrimination due to the patient's progression of clinical syndrome imaging findings consistent with severe spinal stenosis at L2-3 I recommend decompressive laminectomy possible microdiscectomy at that level.  We extensively went over the risks and benefits of the operation with him as well as perioperative course expectations of outcome and alternatives to surgery and he understood and agreed to proceed forward.  Past Medical History:  Diagnosis Date   Anxiety    Depression    Dysrhythmia     a fib   Hypertension    OSA on CPAP    mild obstructive sleep apnea overall with AHI 5.8/h    Past Surgical History:  Procedure Laterality Date   ATRIAL ABLATION SURGERY     ATRIAL FIBRILLATION ABLATION N/A 08/29/2022   Procedure: ATRIAL FIBRILLATION ABLATION;  Surgeon: Efraim Grange, MD;  Location: [facility] INVASIVE CV LAB;  Service: Cardiovascular;  Laterality: N/A;   EXTRACORPOREAL SHOCK WAVE LITHOTRIPSY Right 12/17/2017   Procedure: RIGHT EXTRACORPOREAL SHOCK WAVE LITHOTRIPSY (ESWL);  Surgeon: Marco Severs, MD;  Location: [facility] ORS;  Service: Urology;  Laterality: Right;   KNEE CARTILAGE SURGERY     MOUTH SURGERY     rotator cuff surgery     TONSILLECTOMY      History reviewed. No pertinent family history. Social History:  reports that he has never smoked. He has never used smokeless tobacco. He reports current alcohol use of about 14.0 standard drinks of alcohol per week. He reports that he does not currently use drugs.  Allergies:  Allergies  Allergen Reactions   Amoxicillin Hives   Cefuroxime Rash   Penicillins Hives    Medications Prior to Admission  Medication Sig Dispense Refill   amLODipine  (NORVASC ) 10 MG tablet  Take 10 mg by mouth daily.     apixaban  (ELIQUIS ) 5 MG TABS tablet TAKE 1 TABLET BY MOUTH TWICE  DAILY 180 tablet 1   ascorbic acid  (VITAMIN C ) 500 MG tablet Take 500 mg by mouth daily.     atorvastatin  (LIPITOR) 20 MG tablet Take 20 mg by mouth daily.     Coenzyme Q10 (CO Q-10) 200 MG CAPS Take 1 capsule by mouth in the morning.     diphenhydramine -acetaminophen  (TYLENOL  PM) 25-500 MG TABS tablet Take 2 tablets by mouth at bedtime.     DULoxetine  (CYMBALTA ) 60 MG capsule Take 60 mg by mouth daily.     GINKGO BILOBA PO Take 1 capsule by mouth daily.     lisinopril  (ZESTRIL ) 20 MG tablet Take 1 tablet (20 mg total) by mouth daily. 90 tablet 3   Melatonin 5 MG CHEW Chew 10 mg by mouth at bedtime.     METAMUCIL FIBER PO Take 1 Dose by mouth daily. 1 dose = 1 teaspoon     metoprolol  succinate (TOPROL -XL) 25 MG 24 hr tablet Take 1 tablet (25 mg total) by mouth daily. 90 tablet 3   pregabalin  (LYRICA ) 50 MG capsule Take 150 mg by mouth 2 (two) times daily.     desonide (DESOWEN) 0.05 % ointment Apply 1 Application topically 2 (two) times daily as needed (rash).      Results for orders placed or performed during the hospital encounter of 07/27/23 (from the past 48 hours)  Basic metabolic panel per  protocol     Status: Abnormal   Collection Time: 07/27/23  1:30 PM  Result Value Ref Range   Sodium 136 135 - 145 mmol/L   Potassium 3.7 3.5 - 5.1 mmol/L   Chloride 101 98 - 111 mmol/L   CO2 28 22 - 32 mmol/L   Glucose, Bld 146 (H) 70 - 99 mg/dL    Comment: Glucose reference range applies only to samples taken after fasting for at least 8 hours.   BUN 17 8 - 23 mg/dL   Creatinine, Ser 1.61 0.61 - 1.24 mg/dL   Calcium  9.0 8.9 - 10.3 mg/dL   GFR, Estimated >09 >60 mL/min    Comment: (NOTE) Calculated using the CKD-EPI Creatinine Equation (2021)    Anion gap 7 5 - 15    Comment: Performed at [facility], [address] [place], [place] [address]  CBC per protocol     Status: None    Collection Time: 07/27/23  1:30 PM  Result Value Ref Range   WBC 6.2 4.0 - 10.5 K/uL   RBC 4.73 4.22 - 5.81 MIL/uL   Hemoglobin 15.5 13.0 - 17.0 g/dL   HCT 81.1 91.4 - 78.2 %   MCV 94.5 80.0 - 100.0 fL   MCH 32.8 26.0 - 34.0 pg   MCHC 34.7 30.0 - 36.0 g/dL   RDW 95.6 21.3 - 08.6 %   Platelets 264 150 - 400 K/uL   nRBC 0.0 0.0 - 0.2 %    Comment: Performed at [facility], [address] [place], [place] [address]  Surgical pcr screen     Status: Abnormal   Collection Time: 07/27/23  3:05 PM   Specimen: Nasal Mucosa; Nasal Swab  Result Value Ref Range   MRSA, PCR NEGATIVE NEGATIVE   Staphylococcus aureus POSITIVE (A) NEGATIVE    Comment: (NOTE) The Xpert SA Assay (FDA approved for NASAL specimens in patients 5 years of age and older), is one component of a comprehensive surveillance program. It is not intended to diagnose infection nor to guide or monitor treatment. Performed at [facility], [address] [place], [place] [address]    No results found.  Review of Systems  Musculoskeletal:  Positive for back pain.  Neurological:  Positive for numbness.    Blood pressure 131/78, pulse 63, temperature 97.6 F (36.4 C), temperature source Oral, resp. rate 17, height 6' (1.829 m), weight 113.4 kg, SpO2 98%. Physical Exam HENT:     Head: Normocephalic.     Nose: Nose normal.     Mouth/Throat:     Mouth: Mucous membranes are moist.  Eyes:     Pupils: Pupils are equal, round, and reactive to light.  Cardiovascular:     Rate and Rhythm: Normal rate.  Pulmonary:     Effort: Pulmonary effort is normal.  Abdominal:     General: Abdomen is flat.  Musculoskeletal:        General: Normal range of motion.     Cervical back: Normal range of motion.  Neurological:     Mental Status: He is alert.     Comments: Strength is 5-5 iliopsoas, quads, hamstrings, gastrocs, into BS, and EHL.      Assessment/Plan 68 year old presents for decompressive laminectomy L2-3  possible microdiscectomy at that level.  Ferris Hua, MD 07/29/2023, 11:35 AM

## 2023-07-29 NOTE — Op Note (Signed)
 Preoperative diagnosis: Lumbar spinal stenosis L2-3 right greater than left lumbar radiculopathy with neurogenic claudication.  Postoperative diagnosis: Same.  Procedure: Decompressive laminectomy with partial medial facetectomies and foraminotomies from the right with sublaminar decompression of the left side with microscopic foraminotomies of the right L2-L3 nerve roots and microscopic discectomy.  Surgeon: Gearl Keens.  Assistant: Beula Brunswick.  Anesthesia: General.  EBL: Minimal.  HPI: 68 year old gentleman progressive worsening back bilateral hip and leg pain right greater than left with neurogenic claudication workup revealed severe spinal stenosis at L2-3 with a central disc bulge/herniation.  Patient failed all forms conserve treatment due to his progression of clinical syndrome imaging findings of a concert treatment I recommended decompressive laminectomy primarily from the right with possibility of going to the left and possibility of a microdiscectomy extensive went over the risks and benefits of that operation with him as well as perioperative course expectations of outcome and alternatives of surgery and he understood and agreed to proceed forward.  Operative procedure: Patient was brought into the OR was [facility]on general esthesia positioned prone the Wilson frame his back was prepped and draped in routine sterile fashion I initially started just by exposing the right side and initiated laminotomy after interoperative x-ray confirmed identification appropriate level.  Preoperative x-ray also had localized the incision.  I then proceeded to do subperiosteal dissection on the L2 and L3 lamina I drilled off the inferior half to two thirds of the L2 lamina medial facet complex and superior one third of the L3 lamina.  And then under microscopic illumination the ligament flavum to be markedly hypertrophied and this was removed with a 2 and 3 Miller Kerrison punch under the microscope.  Under by  the medial facet complex allowed identification of the L3 pedicle and I unroofed the L3 foramen decompressing the L3 nerve root along the tract out the foramen.  Then marching superiorly identified the disc base there was felt to be a significant disc bulging there causing stenosis so I reflected the thecal sac medially with a Derica nerve root retractor and incised the disc and cleaned out the disc with Epstein's and pituitaries.  At the end of discectomy there is no further fragments appreciated I passed a coronary dilator easily across the midline without resistance and out both the L2 and L3 foramen on the right.  I also was able to pass the coronary dilator over the dorsal aspect thecal sac over to the left side and get a feel for those foramina also.  I had drilled the undersurface of the lamina and spinous process to where I could actually see the inflection of thecal sac bending back over to the other side to confirm the sublaminar decompression and allow me to palpate the foramen on the contralateral side.  However I did leave the interspinous ligament intact I left the left-sided lamina intact and just exposed and entered from the right.  At the end decompression was no further stenosis Gelfoam was ON top of the dura meticulous hemostasis was maintained and the wound was closed in layers with active Vicryl and a running 4 subcuticular.  Dermabond benzoin Steri-Strips and a sterile dressing was applied patient recovery in stable condition.  At the end the case all needle count sponge counts were correct.

## 2023-07-29 NOTE — Plan of Care (Signed)
 Problem: Education: Goal: Knowledge of General Education information will improve Description: Including pain rating scale, medication(s)/side effects and non-pharmacologic comfort measures 07/29/2023 1622 by Gregor Learned, RN Outcome: Progressing 07/29/2023 1622 by Gregor Learned, RN Outcome: Not Progressing   Problem: Health Behavior/Discharge Planning: Goal: Ability to manage health-related needs will improve 07/29/2023 1622 by Gregor Learned, RN Outcome: Progressing 07/29/2023 1622 by Gregor Learned, RN Outcome: Not Progressing   Problem: Clinical Measurements: Goal: Ability to maintain clinical measurements within normal limits will improve 07/29/2023 1622 by Gregor Learned, RN Outcome: Progressing 07/29/2023 1622 by Gregor Learned, RN Outcome: Not Progressing Goal: Will remain free from infection 07/29/2023 1622 by Gregor Learned, RN Outcome: Progressing 07/29/2023 1622 by Gregor Learned, RN Outcome: Not Progressing Goal: Diagnostic test results will improve 07/29/2023 1622 by Gregor Learned, RN Outcome: Progressing 07/29/2023 1622 by Gregor Learned, RN Outcome: Not Progressing Goal: Respiratory complications will improve 07/29/2023 1622 by Gregor Learned, RN Outcome: Progressing 07/29/2023 1622 by Gregor Learned, RN Outcome: Not Progressing Goal: Cardiovascular complication will be avoided 07/29/2023 1622 by Gregor Learned, RN Outcome: Progressing 07/29/2023 1622 by Gregor Learned, RN Outcome: Not Progressing   Problem: Activity: Goal: Risk for activity intolerance will decrease 07/29/2023 1622 by Gregor Learned, RN Outcome: Progressing 07/29/2023 1622 by Gregor Learned, RN Outcome: Not Progressing   Problem: Nutrition: Goal: Adequate nutrition will be maintained 07/29/2023 1622 by Gregor Learned, RN Outcome: Progressing 07/29/2023 1622 by Gregor Learned, RN Outcome: Not Progressing   Problem: Coping: Goal: Level of anxiety will  decrease 07/29/2023 1622 by Gregor Learned, RN Outcome: Progressing 07/29/2023 1622 by Gregor Learned, RN Outcome: Not Progressing   Problem: Elimination: Goal: Will not experience complications related to bowel motility 07/29/2023 1622 by Gregor Learned, RN Outcome: Progressing 07/29/2023 1622 by Gregor Learned, RN Outcome: Not Progressing Goal: Will not experience complications related to urinary retention 07/29/2023 1622 by Gregor Learned, RN Outcome: Progressing 07/29/2023 1622 by Gregor Learned, RN Outcome: Not Progressing   Problem: Pain Managment: Goal: General experience of comfort will improve and/or be controlled 07/29/2023 1622 by Gregor Learned, RN Outcome: Progressing 07/29/2023 1622 by Gregor Learned, RN Outcome: Not Progressing   Problem: Safety: Goal: Ability to remain free from injury will improve 07/29/2023 1622 by Gregor Learned, RN Outcome: Progressing 07/29/2023 1622 by Gregor Learned, RN Outcome: Not Progressing   Problem: Skin Integrity: Goal: Risk for impaired skin integrity will decrease 07/29/2023 1622 by Gregor Learned, RN Outcome: Progressing 07/29/2023 1622 by Gregor Learned, RN Outcome: Not Progressing   Problem: Education: Goal: Ability to verbalize activity precautions or restrictions will improve 07/29/2023 1622 by Gregor Learned, RN Outcome: Progressing 07/29/2023 1622 by Gregor Learned, RN Outcome: Not Progressing Goal: Knowledge of the prescribed therapeutic regimen will improve 07/29/2023 1622 by Gregor Learned, RN Outcome: Progressing 07/29/2023 1622 by Gregor Learned, RN Outcome: Not Progressing Goal: Understanding of discharge needs will improve 07/29/2023 1622 by Gregor Learned, RN Outcome: Progressing 07/29/2023 1622 by Gregor Learned, RN Outcome: Not Progressing   Problem: Activity: Goal: Ability to avoid complications of mobility impairment will improve 07/29/2023 1622 by Gregor Learned,  RN Outcome: Progressing 07/29/2023 1622 by Gregor Learned, RN Outcome: Not Progressing Goal: Ability to tolerate increased activity will improve 07/29/2023 1622 by Gregor Learned, RN Outcome: Progressing 07/29/2023 1622 by Gregor Learned, RN Outcome: Not Progressing Goal: Will remain free from falls 07/29/2023 1622 by Gregor Learned, RN Outcome: Progressing 07/29/2023 1622 by Gregor Learned, RN Outcome: Not Progressing   Problem: Bowel/Gastric: Goal: Gastrointestinal status for postoperative course will improve 07/29/2023 1622 by  Gregor Learned, RN Outcome: Progressing 07/29/2023 1622 by Gregor Learned, RN Outcome: Not Progressing   Problem: Clinical Measurements: Goal: Ability to maintain clinical measurements within normal limits will improve 07/29/2023 1622 by Gregor Learned, RN Outcome: Progressing 07/29/2023 1622 by Gregor Learned, RN Outcome: Not Progressing Goal: Postoperative complications will be avoided or minimized 07/29/2023 1622 by Gregor Learned, RN Outcome: Progressing 07/29/2023 1622 by Gregor Learned, RN Outcome: Not Progressing Goal: Diagnostic test results will improve 07/29/2023 1622 by Gregor Learned, RN Outcome: Progressing 07/29/2023 1622 by Gregor Learned, RN Outcome: Not Progressing   Problem: Pain Management: Goal: Pain level will decrease 07/29/2023 1622 by Gregor Learned, RN Outcome: Progressing 07/29/2023 1622 by Gregor Learned, RN Outcome: Not Progressing   Problem: Skin Integrity: Goal: Will show signs of wound healing 07/29/2023 1622 by Gregor Learned, RN Outcome: Progressing 07/29/2023 1622 by Gregor Learned, RN Outcome: Not Progressing   Problem: Health Behavior/Discharge Planning: Goal: Identification of resources available to assist in meeting health care needs will improve 07/29/2023 1622 by Gregor Learned, RN Outcome: Progressing 07/29/2023 1622 by Gregor Learned, RN Outcome: Not Progressing    Problem: Bladder/Genitourinary: Goal: Urinary functional status for postoperative course will improve 07/29/2023 1622 by Gregor Learned, RN Outcome: Progressing 07/29/2023 1622 by Gregor Learned, RN Outcome: Not Progressing

## 2023-07-29 NOTE — Anesthesia Procedure Notes (Signed)
 Procedure Name: Intubation Date/Time: 07/29/2023 12:25 PM  Performed by: Alys Julian, CRNAPre-anesthesia Checklist: Patient identified, Emergency Drugs available, Suction available and Patient being monitored Patient Re-evaluated:Patient Re-evaluated prior to induction Oxygen Delivery Method: Circle system utilized Preoxygenation: Pre-oxygenation with 100% oxygen Induction Type: IV induction Ventilation: Two handed mask ventilation required and Oral airway inserted - appropriate to patient size Laryngoscope Size: Miller and 3 Grade View: Grade II Tube type: Oral Tube size: 7.5 mm Number of attempts: 1 Airway Equipment and Method: Stylet and Oral airway Placement Confirmation: ETT inserted through vocal cords under direct vision, positive ETCO2 and breath sounds checked- equal and bilateral Secured at: 23 cm Tube secured with: Tape Dental Injury: Teeth and Oropharynx as per pre-operative assessment

## 2023-07-29 NOTE — Anesthesia Postprocedure Evaluation (Signed)
 Anesthesia Post Note  Patient: [name]  Procedure(s) Performed: Sublaminar decompression - right - L2-L3 (Right: Back)     Patient location during evaluation: PACU Anesthesia Type: General Level of consciousness: awake and alert Pain management: pain level controlled Vital Signs Assessment: post-procedure vital signs reviewed and stable Respiratory status: spontaneous breathing, nonlabored ventilation, respiratory function stable and patient connected to nasal cannula oxygen Cardiovascular status: blood pressure returned to baseline and stable Postop Assessment: no apparent nausea or vomiting Anesthetic complications: no   No notable events documented.  Last Vitals:  Vitals:   07/29/23 1445 07/29/23 1513  BP: 124/82 (!) 152/90  Pulse: 63 65  Resp: 11 18  Temp: 36.6 C 36.4 C  SpO2: 99% 98%    Last Pain:  Vitals:   07/29/23 1513  TempSrc: Oral  PainSc:                  [name]

## 2023-07-29 NOTE — Transfer of Care (Signed)
 Immediate Anesthesia Transfer of Care Note  Patient: [name]  Procedure(s) Performed: Sublaminar decompression - right - L2-L3 (Right: Back)  Patient Location: PACU  Anesthesia Type:General  Level of Consciousness: awake, alert , and oriented  Airway & Oxygen Therapy: Patient Spontanous Breathing and Patient connected to face mask oxygen  Post-op Assessment: Report given to RN, Post -op Vital signs reviewed and stable, Patient moving all extremities X 4, and Patient able to stick tongue midline  Post vital signs: Reviewed and stable  Last Vitals:  Vitals Value Taken Time  BP 110/65 07/29/23 1401  Temp 98.6   Pulse 65 07/29/23 1404  Resp 10 07/29/23 1404  SpO2 96 % 07/29/23 1404  Vitals shown include unfiled device data.  Last Pain:  Vitals:   07/29/23 0937  TempSrc:   PainSc: 5       Patients Stated Pain Goal: 0 (07/29/23 1308)  Complications: No notable events documented.

## 2023-07-30 ENCOUNTER — Encounter (HOSPITAL_COMMUNITY): Payer: Self-pay | Admitting: Neurosurgery

## 2023-07-30 DIAGNOSIS — M48062 Spinal stenosis, lumbar region with neurogenic claudication: Secondary | ICD-10-CM | POA: Diagnosis not present

## 2023-07-30 MED ORDER — CYCLOBENZAPRINE HCL 10 MG PO TABS: 10.0000 mg | ORAL_TABLET | Freq: Three times a day (TID) | ORAL | 0 refills | Status: DC | PRN

## 2023-07-30 MED ORDER — HYDROCODONE-ACETAMINOPHEN 5-325 MG PO TABS: 1.0000 | ORAL_TABLET | ORAL | 0 refills | Status: DC | PRN

## 2023-07-30 MED FILL — Thrombin For Soln 5000 Unit: CUTANEOUS | Qty: 2 | Status: AC

## 2023-07-30 NOTE — Evaluation (Signed)
Occupational Therapy Evaluation Patient Details Name: [name] MRN: [id number] DOB: 01-26-1956 Today's Date: 07/30/2023   History of Present Illness 68 yo M s/p Lumbar Decompression.  PMH includes: TKR, A-fib, OSA   Clinical Impression   Patient admitted for the procedure above.  Patient with good understanding of all precautions, all questions answered, and needing no assist with ADL of mobility.  Recommend follow up with MD as prescribed.         If plan is discharge home, recommend the following: Assist for transportation    Functional Status Assessment  Patient has not had a recent decline in their functional status  Equipment Recommendations  None recommended by OT    Recommendations for Other Services       Precautions / Restrictions Precautions Precautions: Back Precaution Booklet Issued: Yes (comment) Restrictions Weight Bearing Restrictions Per Provider Order: No      Mobility Bed Mobility Overal bed mobility: Modified Independent                  Transfers Overall transfer level: Modified independent Equipment used: None                      Balance Overall balance assessment: Mild deficits observed, not formally tested                                         ADL either performed or assessed with clinical judgement   ADL Overall ADL's : Modified independent                                             Vision Baseline Vision/History: 1 Wears glasses Patient Visual Report: No change from baseline       Perception Perception: Within Functional Limits       Praxis Praxis: WFL       Pertinent Vitals/Pain Pain Assessment Pain Assessment: Faces Faces Pain Scale: Hurts little more Pain Location: Incision Pain Descriptors / Indicators: Aching Pain Intervention(s): Monitored during session     Extremity/Trunk Assessment Upper Extremity Assessment Upper Extremity Assessment: Overall WFL  for tasks assessed   Lower Extremity Assessment Lower Extremity Assessment: Overall WFL for tasks assessed   Cervical / Trunk Assessment Cervical / Trunk Assessment: Back Surgery   Communication     Cognition Arousal: Alert Behavior During Therapy: WFL for tasks assessed/performed Overall Cognitive Status: Within Functional Limits for tasks assessed                                       General Comments   VSS on RA    Exercises     Shoulder Instructions      Home Living Family/patient expects to be discharged to:: Private residence Living Arrangements: Spouse/significant other Available Help at Discharge: Family;Available PRN/intermittently Type of Home: House Home Access: Stairs to enter [employer] of Steps: 3 Entrance Stairs-Rails: None Home Layout: Two level;Able to live on main level with bedroom/bathroom   Alternate Level Stairs-Rails: Right Bathroom Shower/Tub: Producer, television/film/video: Standard Bathroom Accessibility: Yes How Accessible: Accessible via walker Home Equipment: Shower seat - built in  Prior Functioning/Environment Prior Level of Function : Independent/Modified Independent;Driving;Working/employed                        OT Problem List: Pain      OT Treatment/Interventions:      OT Goals(Current goals can be found in the care plan section) Acute Rehab OT Goals Patient Stated Goal: Return home OT Goal Formulation: With patient Time For Goal Achievement: 08/03/23 Potential to Achieve Goals: Good  OT Frequency:      Co-evaluation              AM-PAC OT "6 Clicks" Daily Activity     Outcome Measure Help from another person eating meals?: None Help from another person taking care of personal grooming?: None Help from another person toileting, which includes using toliet, bedpan, or urinal?: None Help from another person bathing (including washing, rinsing, drying)?: None Help  from another person to put on and taking off regular upper body clothing?: None Help from another person to put on and taking off regular lower body clothing?: None 6 Click Score: 24   End of Session Nurse Communication: Mobility status  Activity Tolerance: Patient tolerated treatment well Patient left: in chair;with call bell/phone within reach  OT Visit Diagnosis: Unsteadiness on feet (R26.81)                Time: 6578-4696 OT Time Calculation (min): 21 min Charges:  OT General Charges $OT Visit: 1 Visit OT Evaluation $OT Eval Moderate Complexity: 1 Mod  07/30/2023  RP, OTR/L  Acute Rehabilitation Services  Office:  [phone]   Suzanna Obey 07/30/2023, 9:16 AM

## 2023-07-30 NOTE — Discharge Summary (Signed)
Physician Discharge Summary  Patient ID: AIYDAN STRABALA MRN: [id number] DOB/AGE: [birth year] 68 y.o. Estimated body mass index is 33.91 kg/m as calculated from the following:   Height as of this encounter: 6' (1.829 m).   Weight as of this encounter: 113.4 kg.   Admit date: 07/29/2023 Discharge date: 07/30/2023  Admission Diagnoses: Lumbar spinal stenosis L2-3  Discharge Diagnoses: Same Principal Problem:   Spinal stenosis of lumbar region   Discharged Condition: good  Hospital Course: Patient was admitted to the hospital underwent decompressive laminectomy from the right at L2-3 postoperatively patient did very well recovered in the floor on the floor was ambulating and voiding spontaneously tolerating regular diet and stable for discharge home.  Patient will be discharged scheduled follow-up in 1 to 2 weeks.  Consults: Significant Diagnostic Studies: Treatments: Decompressive laminectomy L2-3 from the right as well as microdiscectomy L2-3 Discharge Exam: Blood pressure 130/74, pulse 71, temperature 97.7 F (36.5 C), temperature source Oral, resp. rate 20, height 6' (1.829 m), weight 113.4 kg, SpO2 99%. Strength 5 out of 5 wound clean dry and intact  Disposition: Home   Allergies as of 07/30/2023       Reactions   Amoxicillin Hives   Cefuroxime Rash   Penicillins Hives   Tolerated cefazolin on 07/29/23        Medication List     TAKE these medications    amLODipine 10 MG tablet Commonly known as: NORVASC Take 10 mg by mouth daily.   ascorbic acid 500 MG tablet Commonly known as: VITAMIN C Take 500 mg by mouth daily.   atorvastatin 20 MG tablet Commonly known as: LIPITOR Take 20 mg by mouth daily.   chlorhexidine 4 % external liquid Commonly known as: HIBICLENS Apply 15 mLs (1 Application total) topically as directed for 30 doses. Use as directed daily for 5 days every other week for 6 weeks.   Co Q-10 200 MG Caps Take 1 capsule by mouth in the morning.    cyclobenzaprine 10 MG tablet Commonly known as: FLEXERIL Take 1 tablet (10 mg total) by mouth 3 (three) times daily as needed for muscle spasms.   desonide 0.05 % ointment Commonly known as: DESOWEN Apply 1 Application topically 2 (two) times daily as needed (rash).   diphenhydramine-acetaminophen 25-500 MG Tabs tablet Commonly known as: TYLENOL PM Take 2 tablets by mouth at bedtime.   DULoxetine 60 MG capsule Commonly known as: CYMBALTA Take 60 mg by mouth daily.   Eliquis 5 MG Tabs tablet Generic drug: apixaban TAKE 1 TABLET BY MOUTH TWICE  DAILY   GINKGO BILOBA PO Take 1 capsule by mouth daily.   HYDROcodone-acetaminophen 5-325 MG tablet Commonly known as: NORCO/VICODIN Take 1-2 tablets by mouth every 4 (four) hours as needed for severe pain (pain score 7-10) or moderate pain (pain score 4-6).   lisinopril 20 MG tablet Commonly known as: ZESTRIL Take 1 tablet (20 mg total) by mouth daily.   Melatonin 5 MG Chew Chew 10 mg by mouth at bedtime.   METAMUCIL FIBER PO Take 1 Dose by mouth daily. 1 dose = 1 teaspoon   metoprolol succinate 25 MG 24 hr tablet Commonly known as: TOPROL-XL Take 1 tablet (25 mg total) by mouth daily.   mupirocin ointment 2 % Commonly known as: BACTROBAN Place 1 Application into the nose 2 (two) times daily for 60 doses. Use as directed 2 times daily for 5 days every other week for 6 weeks.   pregabalin 50 MG capsule Commonly  known as: LYRICA Take 150 mg by mouth 2 (two) times daily.         Signed: Mariam Dollar 07/30/2023, 7:48 AM

## 2023-07-30 NOTE — Progress Notes (Signed)
 Patient alert and oriented, void, ambulate. Surgical site clean and dry no sign of infection. D/c instructions explain and given to the patient all questions answered.

## 2023-08-25 ENCOUNTER — Telehealth: Payer: Self-pay

## 2023-08-25 NOTE — Telephone Encounter (Signed)
Spoke with patient, still no further AF episodes. Patient has obtained a Kardia mobile device for rhythm monitoring. Confirmed appointment with Dr Nelly Laurence on 11/11/23 - no needs at this time

## 2023-08-27 NOTE — Progress Notes (Unsigned)
SLEEP MEDICINE VIRTUAL NOTE via Video Note   Because of [name]'s co-morbid illnesses, he is at least at moderate risk for complications without adequate follow up.  This format is felt to be most appropriate for this patient at this time.  All issues noted in this document were discussed and addressed.  A limited physical exam was performed with this format.  Please refer to the patient's chart for his consent to telehealth for [facility].     Date:  08/28/2023   ID:  [name], DOB Nov 29, 1955, MRN [id number] The patient was identified using 2 identifiers.  Patient Location: Home Provider Location: Office/Clinic   PCP:  Eric Brunette, MD   [facility] Providers Cardiologist:  None Electrophysiologist:  Eric Small, MD      Chief Complaint:  OSA  History of Present Illness:    [name] is a 68 y.o. male with a history of hypertension and paroxysmal atrial fibrillation status post ablation.  At his office visit in December 2023 with [name] he complained of feeling fatigue during the day and due to his atrial fibrillation a home sleep study was ordered.  He would have to nap during the day but attributed that to his age.  He also had problems with snoring during sleep but was never told that he stopped breathing during his sleep.    He underwent home sleep study which showed mild obstructive sleep apnea overall with AHI 5.8/h with most results occurring during supine sleep and severe snoring.  Due to his atrial fibrillation he was started on auto CPAP from 4 to 15 cm H2O.    At last OV he was having problems with the  mask leaking a lot due to his beard.  He tried a nasal pillow mask but felt like he was suffocating with it.  He was not willing to shave his beard to get a better seal so I referred him to sleep dentistry.  He was seen by Dr. Myrtis Avery and has gotten his oral device.  He is currently in the adjustment phase.  He goes back to  him in 3 weeks for another checkup. He feels rested when he gets up in the am and sleeps well at night.  He still snores when he is on his back but off his back he no longer snores.  He had back surgery a month ago and has been sleeping supine recently. He denies any jaw pain, tongue pain or sore throat.   Past Medical History:  Diagnosis Date   Anxiety    Depression    Dysrhythmia     a fib   Hypertension    OSA (obstructive sleep apnea)    mild with AHI 5.8/hr using oral appliance   Past Surgical History:  Procedure Laterality Date   ATRIAL ABLATION SURGERY     ATRIAL FIBRILLATION ABLATION N/A 08/29/2022   Procedure: ATRIAL FIBRILLATION ABLATION;  Surgeon: Eric Small, MD;  Location: [facility];  Service: Cardiovascular;  Laterality: N/A;   EXTRACORPOREAL SHOCK WAVE LITHOTRIPSY Right 12/17/2017   Procedure: RIGHT EXTRACORPOREAL SHOCK WAVE LITHOTRIPSY (ESWL);  Surgeon: Malen Gauze, MD;  Location: [facility] ORS;  Service: Urology;  Laterality: Right;   KNEE CARTILAGE SURGERY     LUMBAR LAMINECTOMY/DECOMPRESSION MICRODISCECTOMY Right 07/29/2023   Procedure: Sublaminar decompression - right - L2-L3;  Surgeon: Eric Citrin, MD;  Location: [facility] OR;  Service: Neurosurgery;  Laterality: Right;  C3   MOUTH SURGERY     rotator cuff surgery     TONSILLECTOMY       Current Meds  Medication Sig   amLODipine (NORVASC) 10 MG tablet Take 10 mg by mouth daily.   apixaban (ELIQUIS) 5 MG TABS tablet TAKE 1 TABLET BY MOUTH TWICE  DAILY   ascorbic acid (VITAMIN C) 500 MG tablet Take 500 mg by mouth daily.   atorvastatin (LIPITOR) 20 MG tablet Take 20 mg by mouth daily.   Coenzyme Q10 (CO Q-10) 200 MG CAPS Take 1 capsule by mouth in the morning.   desonide (DESOWEN) 0.05 % ointment Apply 1 Application topically 2 (two) times daily as needed (rash).   diphenhydramine-acetaminophen (TYLENOL PM) 25-500 MG TABS tablet Take 2 tablets by mouth at bedtime.   DULoxetine (CYMBALTA) 60 MG capsule Take  60 mg by mouth daily.   GINKGO BILOBA PO Take 1 capsule by mouth daily.   Melatonin 5 MG CHEW Chew 10 mg by mouth at bedtime.   METAMUCIL FIBER PO Take 1 Dose by mouth daily. 1 dose = 1 teaspoon   metoprolol succinate (TOPROL-XL) 25 MG 24 hr tablet Take 1 tablet (25 mg total) by mouth daily.   pregabalin (LYRICA) 50 MG capsule Take 150 mg by mouth 2 (two) times daily.     Allergies:   Amoxicillin, Cefuroxime, and Penicillins   Social History   Tobacco Use   Smoking status: Never   Smokeless tobacco: Never   Tobacco comments:    Never smoke 09/26/22  Vaping Use   Vaping status: Never Used  Substance Use Topics   Alcohol use: Yes    Alcohol/week: 14.0 standard drinks of alcohol    Types: 14 Standard drinks or equivalent per week    Comment: 2 shots of liquor daily 10/06/22   Drug use: Not Currently     Family Hx: The patient's family history is not on file.  ROS:   Please see the history of present illness.     All other systems reviewed and are negative.   Prior Sleep studies:   The following studies were reviewed today:  None  Labs/Other Tests and Data Reviewed:     Recent Labs: 07/27/2023: BUN 17; Creatinine, Avery 0.91; Hemoglobin 15.5; Platelets 264; Potassium 3.7; Sodium 136    Wt Readings from Last 3 Encounters:  08/28/23 245 lb (111.1 kg)  07/29/23 250 lb (113.4 kg)  07/27/23 250 lb 14.4 oz (113.8 kg)     Risk Assessment/Calculations:    CHA2DS2-VASc Score = 3   This indicates a 3.2% annual risk of stroke. The patient's score is based upon: CHF History: 0 HTN History: 1 Diabetes History: 0 Stroke History: 0 Vascular Disease History: 1 Age Score: 1 Gender Score: 0     STOP-Bang Score:         Objective:    Vital Signs:  Pulse 69   Ht 6' (1.829 Eric)   Wt 245 lb (111.1 kg)   BMI 33.23 kg/Eric   Well nourished, well developed male in no acute distress. Well appearing, alert and conversant, regular work of breathing,  good skin color  Eyes-  anicteric mouth- oral mucosa is pink  neuro- grossly intact skin- no apparent rash or lesions or cyanosis ASSESSMENT & PLAN:    OSA - The patient is tolerating PAP therapy well without any problems. The PAP download performed by his DME was personally reviewed and interpreted by me today and showed an AHI of 5.2 /  hr on auto CPAP from 4-15 cm H2O with 100% compliance in using more than 4 hours nightly.  The patient has been using and benefiting from PAP use and will continue to benefit from therapy.  -He has had problems with a mask leak likely related to his beard -he tried a nasal pillow mask but felt like he was suffocating with it -he was not willing to shave his beard to get a better seal  -he was seen by sleep dentistry and got an oral appliance that he is tolerating well for the past 6 week -he has followup with Dr. Myrtis Avery in 3 weeks and I told him to let me know if he does not plan a sleep study and we can get a HST  Time:   Today, I have spent 10 minutes with the patient with telehealth technology discussing the above problems.     Medication Adjustments/Labs and Tests Ordered: Current medicines are reviewed at length with the patient today.  Concerns regarding medicines are outlined above.   Tests Ordered: No orders of the defined types were placed in this encounter.   Medication Changes: No orders of the defined types were placed in this encounter.   Follow Up: 1 year  Signed, Armanda Magic, MD  08/28/2023 8:35 AM    [facility]

## 2023-08-28 ENCOUNTER — Ambulatory Visit: Payer: Medicare Other | Attending: Cardiology | Admitting: Cardiology

## 2023-08-28 ENCOUNTER — Encounter: Payer: Self-pay | Admitting: Cardiology

## 2023-08-28 VITALS — HR 69 | Ht 72.0 in | Wt 245.0 lb

## 2023-08-28 DIAGNOSIS — G4733 Obstructive sleep apnea (adult) (pediatric): Secondary | ICD-10-CM | POA: Diagnosis not present

## 2023-08-28 NOTE — Patient Instructions (Signed)
Medication Instructions:  Your physician recommends that you continue on your current medications as directed. Please refer to the Current Medication list given to you today.  *If you need a refill on your cardiac medications before your next appointment, please call your pharmacy*   Lab Work: None.  If you have labs (blood work) drawn today and your tests are completely normal, you will receive your results only by: MyChart Message (if you have MyChart) OR A paper copy in the mail If you have any lab test that is abnormal or we need to change your treatment, we will call you to review the results.   Testing/Procedures: None.   Follow-Up: can do with MyChart, go to [email or web address].    Your next appointment:   1 year(s)  Provider:   Dr. Armanda Magic, MD

## 2023-09-01 ENCOUNTER — Encounter: Payer: Self-pay | Admitting: Cardiovascular Disease

## 2023-09-02 NOTE — Telephone Encounter (Addendum)
 Spoke with patient, AF episodes recorded with device at home, states they have only lasted a few minutes but have been occurring several times in the past week. Patient states BP has been around 120/70 and rates from 96-107 during episodes. Not currently in AF. Confirmed patient is taking eliquis and metoprolol as prescribed. Unable to obtain any episodes last time patient wore monitor. Had discussed repeat ablation with Dr Nelly Laurence in the past but wanted to confirm episodes were in fact AF/AFL. Moved follow up appointment from 11/11/23 to 09/24/23 with Dr Nelly Laurence at patient request. No needs at this time

## 2023-09-07 ENCOUNTER — Encounter: Payer: Self-pay | Admitting: Cardiovascular Disease

## 2023-09-24 ENCOUNTER — Ambulatory Visit: Payer: Medicare Other | Attending: Cardiovascular Disease | Admitting: Cardiovascular Disease

## 2023-09-24 ENCOUNTER — Encounter: Payer: Self-pay | Admitting: Cardiovascular Disease

## 2023-09-24 VITALS — BP 118/80 | HR 68 | Ht 72.0 in | Wt 251.4 lb

## 2023-09-24 DIAGNOSIS — I480 Paroxysmal atrial fibrillation: Secondary | ICD-10-CM

## 2023-09-24 NOTE — Progress Notes (Signed)
 Electrophysiology Office Note:    Date:  09/24/2023   ID:  DAERON CARRENO, DOB 06/21/1956, MRN [id number]  PCP:  Merri Brunette, MD   [facility] Providers Cardiologist:  None Electrophysiologist:  Maurice Small, MD     Referring MD: Merri Brunette, MD   History of Present Illness:    [name] is a 68 y.o. male with a hx listed below, significant for ablation for atrial fibrillation, referred for arrhythmia management.    He had an ablation for atrial fibrillation in 2011 performed in [facility] by dr. Sampson Goon. He had been doing very well until October. He noticed recurrence of AF symptoms after recovering from COVID. Metoprolol was restarted.  He has fatigue and some palpitations and was acutely aware when he had recurrence of AF.  He underwent repeat atrial fibrillation ablation on August 29, 2022.  The right veins had reconnected and required complete re-encirculation. The left veins required reinforcement.    He was diagnosed with sleep apnea and has been tolerating PAP therapy.    In the past year, he has had 2 episodes of atrial fibrillation 1 in June or July 2024, and the other in February 2025.  He submitted tracings from [employer] mobile from the February episode documenting atrial fibrillation.  He is very symptomatic with fatigue during these episodes.       EKGs/Labs/Other Studies Reviewed Today:    [employer] Monitoring time: 7 days   Predominant rhythm: sinus Sinus HR: 49 - 95, AVG 61 bpm   < 1% atrial ectopy < 1% ventricular ectopy   Arrhythmia detected: Brief atrial tachycardia, longest 18.7 seconds   Patient triggered events: correlated with PACs   ECG from 10/26, reviewed by me, showed AF  EKG:   EKG Interpretation Date/Time:  Thursday September 24 2023 16:15:54 EDT Ventricular Rate:  68 PR Interval:  184 QRS Duration:  88 QT Interval:  412 QTC Calculation: 438 R Axis:   -5  Text Interpretation: Sinus rhythm with Possible  Inferior infarct , age undetermined When compared with ECG of 11-May-2023 09:25, No significant change was found Confirmed by [name] [id number]) on 09/24/2023 4:58:52 PM     Recent Labs: 07/27/2023: BUN 17; Creatinine, Ser 0.91; Hemoglobin 15.5; Platelets 264; Potassium 3.7; Sodium 136      Physical Exam:    VS:  BP 118/80 (BP Location: Left Arm, Patient Position: Sitting, Cuff Size: Large)   Pulse 68   Ht 6' (1.829 m)   Wt 251 lb 6.4 oz (114 kg)   SpO2 95%   BMI 34.10 kg/m     Wt Readings from Last 3 Encounters:  09/24/23 251 lb 6.4 oz (114 kg)  08/28/23 245 lb (111.1 kg)  07/29/23 250 lb (113.4 kg)     GEN: Well nourished, well developed in no acute distress, obese CARDIAC: Irregular rhythm, no murmurs, rubs, gallops RESPIRATORY:  Normal work of breathing MUSCULOSKELETAL: no edema    ASSESSMENT & PLAN:    Atrial fibrillation:  possibly organizing into atypical flutter.  Rate controlled on metoprolol. He is symptomatic.  S/p ablation in 2011. Repeat ablation 08/2022 showed complete reconnection of the right veins.  Now again with recurrence of palpitations, documented on his home device (see telephone encounter 08/2023) We discussed management options - monitoring, AAD, or proceed with re-mapping and potential re ablation of the right veins if reconnected with ablation of the posterior LA wall. He would like to proceed with ablation.  We discussed the indication,  rationale, logistics, anticipated benefits, and potential risks of the ablation procedure including but not limited to -- bleed at the groin access site, chest pain, damage to nearby organs such as the diaphragm, lungs, or esophagus, need for a drainage tube, or prolonged hospitalization. I explained that the risk for stroke, heart attack, need for open chest surgery, or even death is very low but not zero. he  expressed understanding and wishes to proceed.   Secondary hypercoagulable state.  CHADS2Vasc is 2 for  hypertension, age  Continue eliquis 5  OSA:  now on CPAP for mild OSA -- not working He has been referred for an oral appliance  Coronary artery calcium 94th percentile for age and sex matched peers Continue  atorvastatin 20, apixaban 5, metoprolol XL 25  Hypertension On Toprol XL 25, lisinopril 20         Medication Adjustments/Labs and Tests Ordered: Current medicines are reviewed at length with the patient today.  Concerns regarding medicines are outlined above.  Orders Placed This Encounter  Procedures   CT CARDIAC MORPH/PULM VEIN W/CM&W/O CA SCORE   CBC   Basic metabolic panel   EKG 12-Lead   No orders of the defined types were placed in this encounter.    Signed, Maurice Small, MD  09/24/2023 5:25 PM    [facility]

## 2023-09-24 NOTE — Patient Instructions (Signed)
 Medication Instructions:  Your physician recommends that you continue on your current medications as directed. Please refer to the Current Medication list given to you today. *If you need a refill on your cardiac medications before your next appointment, please call your pharmacy*   Lab Work: CBC and BMET - please have pre-procedure lab work completed on Wednesday, April 23 at [facility] - no appointment required and this does not require fasting If you have labs (blood work) drawn today and your tests are completely normal, you will receive your results only by: MyChart Message (if you have MyChart) OR A paper copy in the mail If you have any lab test that is abnormal or we need to change your treatment, we will call you to review the results.   Testing/Procedures: Cardiac CT - someone will contact you to schedule this Your physician has requested that you have cardiac CT. Cardiac computed tomography (CT) is a painless test that uses an x-ray machine to take clear, detailed pictures of your heart. For further information please visit [email or web address] Please follow instruction sheet as given.  Atrial Fibrillation Ablation - scheduled on Friday, May 23 Your physician has recommended that you have an ablation. Catheter ablation is a medical procedure used to treat some cardiac arrhythmias (irregular heartbeats). During catheter ablation, a long, thin, flexible tube is put into a blood vessel in your groin (upper thigh), or neck. This tube is called an ablation catheter. It is then guided to your heart through the blood vessel. Radio frequency waves destroy small areas of heart tissue where abnormal heartbeats may cause an arrhythmia to start. Please see the instruction sheet given to you today.   Follow-Up: At [facility], you and your health needs are our priority.  As part of our continuing mission to provide you with exceptional heart care, we have created designated Provider  Care Teams.  These Care Teams include your primary Cardiologist (physician) and Advanced Practice Providers (APPs -  Physician Assistants and Nurse Practitioners) who all work together to provide you with the care you need, when you need it.  We recommend signing up for the patient portal called "MyChart".  Sign up information is provided on this After Visit Summary.  MyChart is used to connect with patients for Virtual Visits (Telemedicine).  Patients are able to view lab/test results, encounter notes, upcoming appointments, etc.  Non-urgent messages can be sent to your provider as well.   To learn more about what you can do with MyChart, go to [email or web address].    Your next appointment:   We will schedule follow up for you after your ablation   Provider:   [name], MD

## 2023-10-27 ENCOUNTER — Encounter: Payer: Self-pay | Admitting: Cardiovascular Disease

## 2023-11-04 LAB — CBC
Hematocrit: 46.3 % (ref 37.5–51.0)
Hemoglobin: 15.2 g/dL (ref 13.0–17.7)
MCH: 31.9 pg (ref 26.6–33.0)
MCHC: 32.8 g/dL (ref 31.5–35.7)
MCV: 97 fL (ref 79–97)
Platelets: 294 10*3/uL (ref 150–450)
RBC: 4.77 x10E6/uL (ref 4.14–5.80)
RDW: 11.8 % (ref 11.6–15.4)
WBC: 5.3 10*3/uL (ref 3.4–10.8)

## 2023-11-05 LAB — BASIC METABOLIC PANEL WITH GFR
BUN/Creatinine Ratio: 17 (ref 10–24)
BUN: 17 mg/dL (ref 8–27)
CO2: 23 mmol/L (ref 20–29)
Calcium: 9.4 mg/dL (ref 8.6–10.2)
Chloride: 100 mmol/L (ref 96–106)
Creatinine, Ser: 1 mg/dL (ref 0.76–1.27)
Glucose: 167 mg/dL — ABNORMAL HIGH (ref 70–99)
Potassium: 4.2 mmol/L (ref 3.5–5.2)
Sodium: 138 mmol/L (ref 134–144)
eGFR: 84 mL/min/{1.73_m2} (ref >59)

## 2023-11-10 ENCOUNTER — Encounter: Payer: Self-pay | Admitting: Cardiovascular Disease

## 2023-11-11 ENCOUNTER — Ambulatory Visit: Payer: Medicare Other | Admitting: Cardiovascular Disease

## 2023-11-20 ENCOUNTER — Telehealth: Payer: Self-pay

## 2023-11-20 ENCOUNTER — Ambulatory Visit (HOSPITAL_COMMUNITY)
Admission: RE | Admit: 2023-11-20 | Discharge: 2023-11-20 | Disposition: A | Discharge status: Home / Self Care | Source: Ambulatory Visit | Attending: Cardiovascular Disease | Admitting: Cardiovascular Disease

## 2023-11-20 DIAGNOSIS — I480 Paroxysmal atrial fibrillation: Secondary | ICD-10-CM | POA: Diagnosis present

## 2023-11-20 MED ORDER — METOPROLOL TARTRATE 5 MG/5ML IV SOLN: 10.0000 mg | Freq: Once | INTRAVENOUS | Status: DC | PRN

## 2023-11-20 MED ORDER — IOHEXOL 350 MG/ML SOLN
100.0000 mL | Freq: Once | INTRAVENOUS | Status: AC | PRN
  Administered 2023-11-20: 100 mL via INTRAVENOUS

## 2023-11-20 MED ORDER — DILTIAZEM HCL 25 MG/5ML IV SOLN: 10.0000 mg | INTRAVENOUS | Status: DC | PRN

## 2023-11-20 NOTE — Telephone Encounter (Signed)
 Called pt and moved his Afib Ablation procedure with Dr. Arlester Ladd from 5/23 to 5/20 at 7:30 AM - he is aware to arrive at 5:30 AM.

## 2023-11-23 ENCOUNTER — Encounter: Payer: Self-pay | Admitting: Cardiovascular Disease

## 2023-11-24 ENCOUNTER — Telehealth (HOSPITAL_COMMUNITY): Payer: Self-pay

## 2023-11-24 NOTE — Telephone Encounter (Signed)
 Spoke with patient to discuss upcoming procedure.   CT: completed.  Labs: completed.   Any recent signs of acute illness or been started on antibiotics? No Any new medications started? No Any medications to hold? No Any missed doses of blood thinner? No  Advised patient to continue taking ANTICOAGULANT: Eliquis  (Apixaban ) twice daily without missing any doses.  Medication instructions:  On the morning of your procedure DO NOT take any medication., including Eliquis  or the procedure may be rescheduled. Nothing to eat or drink after midnight prior to your procedure.  Confirmed patient is scheduled for Atrial Fibrillation Ablation on Tuesday, May 20 with Dr. Marlane Silver. Instructed patient to arrive at the Main Entrance A at [facility]: [address] [place], [place] [address] and check in at Admitting at 5:30 AM.   Advised of plan to go home the same day and will only stay overnight if medically necessary. You MUST have a responsible adult to drive you home and MUST be with you the first 24 hours after you arrive home or your procedure could be cancelled.  Patient verbalized understanding to all instructions provided and agreed to proceed with procedure.

## 2023-11-30 ENCOUNTER — Telehealth: Payer: Self-pay | Admitting: Cardiovascular Disease

## 2023-11-30 NOTE — Anesthesia Preprocedure Evaluation (Addendum)
 Anesthesia Evaluation  Patient identified by MRN, date of birth, ID band Patient awake    Reviewed: Allergy & Precautions, NPO status , Patient's Chart, lab work & pertinent test results, reviewed documented beta blocker date and time   History of Anesthesia Complications Negative for: history of anesthetic complications  Airway Mallampati: III  TM Distance: >3 FB Neck ROM: Full    Dental no notable dental hx.    Pulmonary sleep apnea (mild OSA, uses dental device)    Pulmonary exam normal        Cardiovascular hypertension, Pt. on medications and Pt. on home beta blockers Normal cardiovascular exam+ dysrhythmias (last ablation 08/2022, eliquis ) Atrial Fibrillation  Rhythm:Irregular Rate:Normal     Neuro/Psych  PSYCHIATRIC DISORDERS Anxiety Depression       GI/Hepatic negative GI ROS,,,(+)       alcohol use  Endo/Other  negative endocrine ROS    Renal/GU negative Renal ROS  negative genitourinary   Musculoskeletal negative musculoskeletal ROS (+)    Abdominal   Peds  Hematology negative hematology ROS (+)   Anesthesia Other Findings   Reproductive/Obstetrics negative OB ROS                              Anesthesia Physical Anesthesia Plan  ASA: 2  Anesthesia Plan: General   Post-op Pain Management: Minimal or no pain anticipated   Induction: Intravenous  PONV Risk Score and Plan: 2 and Ondansetron , Treatment may vary due to age or medical condition and Midazolam   Airway Management Planned: Oral ETT  Additional Equipment: None  Intra-op Plan:   Post-operative Plan: Extubation in OR  Informed Consent: I have reviewed the patients History and Physical, chart, labs and discussed the procedure including the risks, benefits and alternatives for the proposed anesthesia with the patient or authorized representative who has indicated his/her understanding and acceptance.      Dental advisory given  Plan Discussed with: CRNA  Anesthesia Plan Comments: (Airway note from last ablation: Ventilation: Mask ventilation without difficulty and Oral airway inserted - appropriate to patient size Laryngoscope Size: Mac and 4 Grade View: Grade II Tube type: Oral Tube size: 7.5 mm Number of attempts: 1 )       Anesthesia Quick Evaluation

## 2023-11-30 NOTE — Telephone Encounter (Signed)
 Pt is sick and wants to talk about ablation for tomorrow

## 2023-11-30 NOTE — Telephone Encounter (Signed)
 Spoke with patient, currently only experiencing cough, sore throat with drainage. Denies fever, nausea, or diarrhea. Tested negative for covid and strep at PCP, no antibiotics given. Dr Arlester Ladd made aware in clinic, no changes in ablation plans for tomorrow.

## 2023-11-30 NOTE — Pre-Procedure Instructions (Signed)
 Instructed patient on the following items: Arrival time 0515 Nothing to eat or drink after midnight No meds AM of procedure Responsible person to drive you home and stay with you for 24 hrs  Have you missed any doses of anti-coagulant Eliquis- takes twice a day, hasn't missed any dose in last 4 weeks.  Don't take dose morning of procedure.

## 2023-12-01 ENCOUNTER — Ambulatory Visit (HOSPITAL_COMMUNITY)
Admission: RE | Disposition: A | Discharge status: Home / Self Care | Payer: Self-pay | Source: Home / Self Care | Attending: Cardiovascular Disease

## 2023-12-01 ENCOUNTER — Ambulatory Visit (HOSPITAL_COMMUNITY): Payer: Self-pay | Admitting: Anesthesiology

## 2023-12-01 ENCOUNTER — Ambulatory Visit (HOSPITAL_BASED_OUTPATIENT_CLINIC_OR_DEPARTMENT_OTHER): Payer: Self-pay | Admitting: Anesthesiology

## 2023-12-01 ENCOUNTER — Ambulatory Visit (HOSPITAL_COMMUNITY)
Admission: RE | Admit: 2023-12-01 | Discharge: 2023-12-01 | Disposition: A | Discharge status: Home / Self Care | Attending: Cardiovascular Disease | Admitting: Cardiovascular Disease

## 2023-12-01 ENCOUNTER — Other Ambulatory Visit: Payer: Self-pay

## 2023-12-01 ENCOUNTER — Encounter (HOSPITAL_COMMUNITY): Payer: Self-pay | Admitting: Cardiovascular Disease

## 2023-12-01 DIAGNOSIS — Z7901 Long term (current) use of anticoagulants: Secondary | ICD-10-CM | POA: Diagnosis not present

## 2023-12-01 DIAGNOSIS — D6869 Other thrombophilia: Secondary | ICD-10-CM | POA: Diagnosis not present

## 2023-12-01 DIAGNOSIS — G4733 Obstructive sleep apnea (adult) (pediatric): Secondary | ICD-10-CM | POA: Diagnosis not present

## 2023-12-01 DIAGNOSIS — I4819 Other persistent atrial fibrillation: Secondary | ICD-10-CM

## 2023-12-01 DIAGNOSIS — I10 Essential (primary) hypertension: Secondary | ICD-10-CM | POA: Insufficient documentation

## 2023-12-01 DIAGNOSIS — Z8616 Personal history of COVID-19: Secondary | ICD-10-CM | POA: Insufficient documentation

## 2023-12-01 DIAGNOSIS — I4891 Unspecified atrial fibrillation: Secondary | ICD-10-CM | POA: Diagnosis not present

## 2023-12-01 HISTORY — PX: ATRIAL FIBRILLATION ABLATION: EP1191

## 2023-12-01 MED ORDER — SODIUM CHLORIDE 0.9 % IV SOLN: 250.0000 mL | INTRAVENOUS | Status: DC | PRN

## 2023-12-01 MED ORDER — ATROPINE SULFATE 1 MG/10ML IJ SOSY
PREFILLED_SYRINGE | INTRAMUSCULAR | Status: AC
  Filled 2023-12-01: qty 10

## 2023-12-01 MED ORDER — SODIUM CHLORIDE 0.9 % IV SOLN: INTRAVENOUS | Status: DC

## 2023-12-01 MED ORDER — ONDANSETRON HCL 4 MG/2ML IJ SOLN: 4.0000 mg | Freq: Four times a day (QID) | INTRAMUSCULAR | Status: DC | PRN

## 2023-12-01 MED ORDER — HEPARIN (PORCINE) IN NACL 1000-0.9 UT/500ML-% IV SOLN
INTRAVENOUS | Status: DC | PRN
  Administered 2023-12-01 (×3): 500 mL

## 2023-12-01 MED ORDER — HEPARIN SODIUM (PORCINE) 1000 UNIT/ML IJ SOLN
INTRAMUSCULAR | Status: AC
  Filled 2023-12-01: qty 10

## 2023-12-01 MED ORDER — FENTANYL CITRATE (PF) 100 MCG/2ML IJ SOLN
INTRAMUSCULAR | Status: AC
  Filled 2023-12-01: qty 2

## 2023-12-01 MED ORDER — DEXAMETHASONE SODIUM PHOSPHATE 10 MG/ML IJ SOLN
INTRAMUSCULAR | Status: DC | PRN
  Administered 2023-12-01: 10 mg via INTRAVENOUS

## 2023-12-01 MED ORDER — PROTAMINE SULFATE 10 MG/ML IV SOLN
INTRAVENOUS | Status: DC | PRN
  Administered 2023-12-01: 50 mg via INTRAVENOUS

## 2023-12-01 MED ORDER — SODIUM CHLORIDE 0.9% FLUSH: 3.0000 mL | INTRAVENOUS | Status: DC | PRN

## 2023-12-01 MED ORDER — ONDANSETRON HCL 4 MG/2ML IJ SOLN
INTRAMUSCULAR | Status: DC | PRN
  Administered 2023-12-01: 4 mg via INTRAVENOUS

## 2023-12-01 MED ORDER — MIDAZOLAM HCL 2 MG/2ML IJ SOLN
INTRAMUSCULAR | Status: AC
  Filled 2023-12-01: qty 2

## 2023-12-01 MED ORDER — ROCURONIUM BROMIDE 10 MG/ML (PF) SYRINGE
PREFILLED_SYRINGE | INTRAVENOUS | Status: DC | PRN
  Administered 2023-12-01: 60 mg via INTRAVENOUS
  Administered 2023-12-01: 20 mg via INTRAVENOUS

## 2023-12-01 MED ORDER — ATROPINE SULFATE 1 MG/10ML IJ SOSY
PREFILLED_SYRINGE | INTRAMUSCULAR | Status: DC | PRN
  Administered 2023-12-01: 1 mg via INTRAVENOUS

## 2023-12-01 MED ORDER — HEPARIN SODIUM (PORCINE) 1000 UNIT/ML IJ SOLN
INTRAMUSCULAR | Status: DC | PRN
  Administered 2023-12-01: 18000 [IU] via INTRAVENOUS

## 2023-12-01 MED ORDER — DEXMEDETOMIDINE HCL IN NACL 80 MCG/20ML IV SOLN
INTRAVENOUS | Status: AC
  Filled 2023-12-01: qty 40

## 2023-12-01 MED ORDER — FENTANYL CITRATE (PF) 250 MCG/5ML IJ SOLN
INTRAMUSCULAR | Status: DC | PRN
Start: 2023-12-01 — End: 2023-12-01
  Administered 2023-12-01 (×2): 50 ug via INTRAVENOUS

## 2023-12-01 MED ORDER — SUGAMMADEX SODIUM 200 MG/2ML IV SOLN
INTRAVENOUS | Status: DC | PRN
  Administered 2023-12-01: 200 mg via INTRAVENOUS

## 2023-12-01 MED ORDER — PHENYLEPHRINE HCL-NACL 20-0.9 MG/250ML-% IV SOLN
INTRAVENOUS | Status: DC | PRN
  Administered 2023-12-01: 35 ug/min via INTRAVENOUS

## 2023-12-01 MED ORDER — PROPOFOL 500 MG/50ML IV EMUL
INTRAVENOUS | Status: DC | PRN
  Administered 2023-12-01: 80 ug/kg/min via INTRAVENOUS

## 2023-12-01 MED ORDER — ACETAMINOPHEN 325 MG PO TABS: 650.0000 mg | ORAL_TABLET | ORAL | Status: DC | PRN

## 2023-12-01 MED ORDER — SODIUM CHLORIDE 0.9% FLUSH: 3.0000 mL | Freq: Two times a day (BID) | INTRAVENOUS | Status: DC

## 2023-12-01 MED ORDER — LIDOCAINE 2% (20 MG/ML) 5 ML SYRINGE
INTRAMUSCULAR | Status: DC | PRN
  Administered 2023-12-01: 100 mg via INTRAVENOUS

## 2023-12-01 MED ORDER — PROPOFOL 10 MG/ML IV BOLUS
INTRAVENOUS | Status: DC | PRN
  Administered 2023-12-01: 150 mg via INTRAVENOUS

## 2023-12-01 MED ORDER — PHENYLEPHRINE 80 MCG/ML (10ML) SYRINGE FOR IV PUSH (FOR BLOOD PRESSURE SUPPORT)
PREFILLED_SYRINGE | INTRAVENOUS | Status: DC | PRN
  Administered 2023-12-01: 80 ug via INTRAVENOUS

## 2023-12-01 MED ORDER — MIDAZOLAM HCL 2 MG/2ML IJ SOLN
INTRAMUSCULAR | Status: DC | PRN
  Administered 2023-12-01: 2 mg via INTRAVENOUS

## 2023-12-01 SURGICAL SUPPLY — 19 items
BAG SNAP BAND KOVER 36X36 (MISCELLANEOUS) IMPLANT
CABLE PFA RX CATH CONN (CABLE) IMPLANT
CATH FARAWAVE ABLATION 31 (CATHETERS) IMPLANT
CATH GE 8FR SOUNDSTAR (CATHETERS) IMPLANT
CATH OCTARAY 2.0 F 3-3-3-3-3 (CATHETERS) IMPLANT
CATH WEBSTER BI DIR CS D-F CRV (CATHETERS) IMPLANT
CLOSURE PERCLOSE PROSTYLE (VASCULAR PRODUCTS) IMPLANT
COVER SWIFTLINK CONNECTOR (BAG) ×1 IMPLANT
DEVICE CLOSURE MYNXGRIP 6/7F (Vascular Products) IMPLANT
DILATOR VESSEL 38 20CM 16FR (INTRODUCER) IMPLANT
GUIDEWIRE INQWIRE 1.5J.035X260 (WIRE) IMPLANT
KIT VERSACROSS CNCT FARADRIVE (KITS) IMPLANT
MAT PREVALON FULL STRYKER (MISCELLANEOUS) IMPLANT
PACK EP LF (CUSTOM PROCEDURE TRAY) ×1 IMPLANT
PAD DEFIB RADIO PHYSIO CONN (PAD) ×1 IMPLANT
SHEATH AVANTI 11CM 9FR (SHEATH) IMPLANT
SHEATH FARADRIVE STEERABLE (SHEATH) IMPLANT
SHEATH PINNACLE 8F 10CM (SHEATH) IMPLANT
SHEATH PROBE COVER 6X72 (BAG) IMPLANT

## 2023-12-01 NOTE — Anesthesia Postprocedure Evaluation (Signed)
 Anesthesia Post Note  Patient: [name]  Procedure(s) Performed: ATRIAL FIBRILLATION ABLATION     Patient location during evaluation: PACU Anesthesia Type: General Level of consciousness: awake and alert Pain management: pain level controlled Vital Signs Assessment: post-procedure vital signs reviewed and stable Respiratory status: spontaneous breathing, nonlabored ventilation and respiratory function stable Cardiovascular status: blood pressure returned to baseline Postop Assessment: no apparent nausea or vomiting Anesthetic complications: no   No notable events documented.  Last Vitals:  Vitals:   12/01/23 1130 12/01/23 1145  BP:    Pulse: 64 67  Resp: 13 19  Temp:    SpO2: 90% 92%    Last Pain:  Vitals:   12/01/23 0955  TempSrc:   PainSc: 0-No pain                 [name]

## 2023-12-01 NOTE — Transfer of Care (Signed)
 Immediate Anesthesia Transfer of Care Note  Patient: [name]  Procedure(s) Performed: ATRIAL FIBRILLATION ABLATION  Patient Location: PACU and Cath Lab  Anesthesia Type:General  Level of Consciousness: awake, alert , and oriented  Airway & Oxygen Therapy: Patient Spontanous Breathing and Patient connected to nasal cannula oxygen  Post-op Assessment: Report given to RN and Post -op Vital signs reviewed and stable  Post vital signs: stable  Last Vitals:  Vitals Value Taken Time  BP 119/72 12/01/23 0907  Temp    Pulse 79 12/01/23 0910  Resp 17 12/01/23 0910  SpO2 97 % 12/01/23 0910  Vitals shown include unfiled device data.  Last Pain:  Vitals:   12/01/23 0603  TempSrc: Oral  PainSc:          Complications: No notable events documented.

## 2023-12-01 NOTE — Anesthesia Procedure Notes (Signed)
 Procedure Name: Intubation Date/Time: 12/01/2023 7:50 AM  Performed by: Mabel Savage, CRNAPre-anesthesia Checklist: Patient identified, Emergency Drugs available, Suction available, Patient being monitored and Timeout performed Patient Re-evaluated:Patient Re-evaluated prior to induction Oxygen Delivery Method: Circle system utilized Preoxygenation: Pre-oxygenation with 100% oxygen Induction Type: IV induction Ventilation: Oral airway inserted - appropriate to patient size Tube type: Oral Tube size: 7.0 mm Number of attempts: 1 Airway Equipment and Method: Stylet and Video-laryngoscopy Placement Confirmation: ETT inserted through vocal cords under direct vision, positive ETCO2, breath sounds checked- equal and bilateral and CO2 detector Secured at: 23 cm Tube secured with: Tape Dental Injury: Teeth and Oropharynx as per pre-operative assessment

## 2023-12-01 NOTE — Discharge Instructions (Signed)

## 2023-12-01 NOTE — H&P (Signed)
 Electrophysiology Office Note:    Date:  12/01/2023   ID:  [name], DOB 12/24/1955, MRN [id number]  PCP:  Faustina Hood, MD   [facility] Providers Cardiologist:  None Electrophysiologist:  Efraim Grange, MD     Referring MD: Efraim Grange, MD   History of Present Illness:    [name] is a 68 y.o. male with a hx listed below, significant for ablation for atrial fibrillation, referred for arrhythmia management.    He had an ablation for atrial fibrillation in 2011 performed in [facility] by dr. Harwood Lingo. He had been doing very well until October. He noticed recurrence of AF symptoms after recovering from COVID. Metoprolol  was restarted.  He has fatigue and some palpitations and was acutely aware when he had recurrence of AF.  He underwent repeat atrial fibrillation ablation on August 29, 2022.  The right veins had reconnected and required complete re-encirculation. The left veins required reinforcement.    He was diagnosed with sleep apnea and has been tolerating PAP therapy.    In the past year, he has had 2 episodes of atrial fibrillation 1 in June or July 2024, and the other in February 2025.  He submitted tracings from cardia mobile from the February episode documenting atrial fibrillation.  He is very symptomatic with fatigue during these episodes.     I reviewed the patient's CT and labs. There was no LAA thrombus. he  has not missed any doses of anticoagulation, and he took his dose last night. There have been no changes in the patient's diagnoses, medications, or condition since our recent clinic visit.    EKGs/Labs/Other Studies Reviewed Today:    Mobitor Monitoring time: 7 days   Predominant rhythm: sinus Sinus HR: 49 - 95, AVG 61 bpm   < 1% atrial ectopy < 1% ventricular ectopy   Arrhythmia detected: Brief atrial tachycardia, longest 18.7 seconds   Patient triggered events: correlated with PACs   ECG from 10/26, reviewed  by me, showed AF  EKG:         Recent Labs: 11/04/2023: BUN 17; Creatinine, Ser 1.00; Hemoglobin 15.2; Platelets 294; Potassium 4.2; Sodium 138      Physical Exam:    VS:  BP 136/86   Pulse 68   Temp 98.5 F (36.9 C) (Oral)   Resp 18   Ht 6' (1.829 m)   Wt 111.1 kg   SpO2 95%   BMI 33.23 kg/m     Wt Readings from Last 3 Encounters:  12/01/23 111.1 kg  09/24/23 114 kg  08/28/23 111.1 kg     GEN: Well nourished, well developed in no acute distress, obese CARDIAC: Irregular rhythm, no murmurs, rubs, gallops RESPIRATORY:  Normal work of breathing MUSCULOSKELETAL: no edema    ASSESSMENT & PLAN:    Atrial fibrillation:  possibly organizing into atypical flutter.  Rate controlled on metoprolol . He is symptomatic.  S/p ablation in 2011. Repeat ablation 08/2022 showed complete reconnection of the right veins.  Now again with recurrence of palpitations, documented on his home device (see telephone encounter 08/2023) We discussed management options - monitoring, AAD, or proceed with re-mapping and potential re ablation of the right veins if reconnected with ablation of the posterior LA wall. He would like to proceed with ablation.  We discussed the indication, rationale, logistics, anticipated benefits, and potential risks of the ablation procedure including but not limited to -- bleed at the groin access site, chest pain, damage to nearby  organs such as the diaphragm, lungs, or esophagus, need for a drainage tube, or prolonged hospitalization. I explained that the risk for stroke, heart attack, need for open chest surgery, or even death is very low but not zero. he  expressed understanding and wishes to proceed.   Secondary hypercoagulable state.  CHADS2Vasc is 2 for hypertension, age  Continue eliquis  5  OSA:  Mild -- using a mouth appliance  Coronary artery calcium  94th percentile for age and sex matched peers Continue  atorvastatin  20, apixaban  5, metoprolol  XL  25  Hypertension On Toprol  XL 25, lisinopril  20         Medication Adjustments/Labs and Tests Ordered: Current medicines are reviewed at length with the patient today.  Concerns regarding medicines are outlined above.  Orders Placed This Encounter  Procedures   Informed Consent Details: Physician/Practitioner Attestation; Transcribe to consent form and obtain patient signature   Initiate Pre-op Protocol   Void on call to EP Lab   Confirm CBC and BMP (or CMP) results within 7 days for inpatient and 30 days for outpatient:   Clip right and left femoral area PM before surgery   Clip right internal jugular area PM before surgery   Pre-admission testing diagnosis   EP STUDY   Insert peripheral IV   Meds ordered this encounter  Medications   0.9 %  sodium chloride  infusion     Signed, Efraim Grange, MD  12/01/2023 7:09 AM    [facility]

## 2023-12-02 ENCOUNTER — Telehealth (HOSPITAL_COMMUNITY): Payer: Self-pay

## 2023-12-02 LAB — POCT ACTIVATED CLOTTING TIME: Activated Clotting Time: 366 s

## 2023-12-02 NOTE — Telephone Encounter (Signed)
 Spoke with patient to complete post procedure follow up call.  Patient reports no complications with groin sites.   Instructions reviewed with patient:  Remove large bandage at puncture site after 24 hours. It is normal to have bruising, tenderness and a pea or marble sized lump/knot at the groin site which can take up to three months to resolve.  Get help right away if you notice sudden swelling at the puncture site.  Check your puncture site every day for signs of infection: fever, redness, swelling, pus drainage, warmth, foul odor or excessive pain. If this occurs, please call the office at [phone], to speak with the nurse. Get help right away if your puncture site is bleeding and the bleeding does not stop after applying firm pressure to the area.  You may continue to have skipped beats/ atrial fibrillation during the first several months after your procedure.  It is very important not to miss any doses of your blood thinner Eliquis . Patient restarted taking this medication on 12/01/23.   You will follow up with the Afib clinic on 12/29/23 and follow up with the APP on 02/25/24.   Patient verbalized understanding to all instructions provided.

## 2023-12-03 ENCOUNTER — Other Ambulatory Visit (HOSPITAL_COMMUNITY): Payer: Self-pay | Admitting: Cardiovascular Disease

## 2023-12-03 MED FILL — Fentanyl Citrate Preservative Free (PF) Inj 100 MCG/2ML: INTRAMUSCULAR | Qty: 2 | Status: AC

## 2023-12-03 NOTE — Telephone Encounter (Signed)
 Prescription refill request for Eliquis  received. Indication: afib  Last office visit: Mealor, 09/24/2023 Scr: 1.0, 11/04/2023 Age: 68 yo  Weight: 111.1 kg   Refill sent.

## 2023-12-09 ENCOUNTER — Other Ambulatory Visit: Payer: Self-pay

## 2023-12-09 MED ORDER — LISINOPRIL 20 MG PO TABS: 20.0000 mg | ORAL_TABLET | Freq: Every day | ORAL | 3 refills | Status: AC

## 2023-12-10 ENCOUNTER — Encounter: Payer: Self-pay | Admitting: Emergency Medicine

## 2023-12-15 ENCOUNTER — Other Ambulatory Visit: Payer: Self-pay | Admitting: Cardiovascular Disease

## 2023-12-29 ENCOUNTER — Ambulatory Visit (HOSPITAL_COMMUNITY): Admitting: Physician Assistant

## 2023-12-29 ENCOUNTER — Encounter (HOSPITAL_COMMUNITY): Payer: Self-pay | Admitting: Physician Assistant

## 2023-12-29 ENCOUNTER — Encounter (HOSPITAL_COMMUNITY): Payer: Self-pay

## 2023-12-29 ENCOUNTER — Ambulatory Visit (HOSPITAL_COMMUNITY)
Admission: RE | Admit: 2023-12-29 | Discharge: 2023-12-29 | Disposition: A | Discharge status: Home / Self Care | Source: Ambulatory Visit | Attending: Physician Assistant | Admitting: Physician Assistant

## 2023-12-29 VITALS — BP 136/84 | HR 70 | Ht 72.0 in | Wt 245.8 lb

## 2023-12-29 DIAGNOSIS — I480 Paroxysmal atrial fibrillation: Secondary | ICD-10-CM | POA: Diagnosis not present

## 2023-12-29 DIAGNOSIS — D6869 Other thrombophilia: Secondary | ICD-10-CM

## 2023-12-29 NOTE — Progress Notes (Incomplete)
 Primary Care Physician: Faustina Hood, MD Referring Physician: Dr. Josephus Nida Primary EP: Dr Arlester Ladd   [name] is a 68 y.o. male with a h/o remote afib having an ablation in 2011 by Dr. Harwood Lingo in [facility] area. He had done well until the first of October when he developed Covid and then he noted return of afib. This was documented on EKG when he saw Dr. Felipe Horton 04/28/22. He was started on eliquis  for stroke prevention and metoprolol  succinate. He is s/p repeat ablation with Dr Arlester Ladd on 08/29/22 and 12/01/23.  Patient returns for follow up for atrial fibrillation. ***  Today, he  denies symptoms of ***palpitations, chest pain, shortness of breath, orthopnea, PND, lower extremity edema, dizziness, presyncope, syncope, bleeding, or neurologic sequela. The patient is tolerating medications without difficulties and is otherwise without complaint today.    Past Medical History:  Diagnosis Date   Anxiety    Depression    Dysrhythmia     a fib   Hypertension    OSA (obstructive sleep apnea)    mild with AHI 5.8/hr using oral appliance    Current Outpatient Medications  Medication Sig Dispense Refill   amLODipine  (NORVASC ) 10 MG tablet Take 10 mg by mouth daily.     apixaban  (ELIQUIS ) 5 MG TABS tablet TAKE 1 TABLET BY MOUTH TWICE  DAILY 180 tablet 1   ascorbic acid  (VITAMIN C ) 500 MG tablet Take 500 mg by mouth daily.     atorvastatin  (LIPITOR) 20 MG tablet Take 20 mg by mouth daily.     Coenzyme Q10 (CO Q-10) 200 MG CAPS Take 1 capsule by mouth in the morning.     desonide (DESOWEN) 0.05 % ointment Apply 1 Application topically 2 (two) times daily as needed (rash).     diphenhydramine -acetaminophen  (TYLENOL  PM) 25-500 MG TABS tablet Take 2 tablets by mouth at bedtime.     DULoxetine  (CYMBALTA ) 60 MG capsule Take 60 mg by mouth daily.     Ginkgo Biloba 120 MG CAPS Take 120 mg by mouth daily.     lisinopril  (ZESTRIL ) 20 MG tablet Take 1 tablet (20 mg total) by mouth daily. 90  tablet 3   Melatonin 10 MG CAPS Take 10 mg by mouth at bedtime.     METAMUCIL FIBER PO Take 1 Scoop by mouth daily.     metoprolol  succinate (TOPROL -XL) 25 MG 24 hr tablet Take 1 tablet (25 mg total) by mouth daily. 90 tablet 2   pregabalin  (LYRICA ) 50 MG capsule Take 100 mg by mouth 2 (two) times daily as needed (pain).     No current facility-administered medications for this visit.    ROS- All systems are reviewed and negative except as per the HPI above  Physical Exam: There were no vitals filed for this visit.  Wt Readings from Last 3 Encounters:  12/01/23 111.1 kg  09/24/23 114 kg  08/28/23 111.1 kg    GEN: Well nourished, well developed in no acute distress NECK: No JVD; No carotid bruits CARDIAC: {EPRHYTHM:28826}, no murmurs, rubs, gallops RESPIRATORY:  Clear to auscultation without rales, wheezing or rhonchi  ABDOMEN: Soft, non-tender, non-distended EXTREMITIES:  No edema; No deformity    EKG today demonstrates ***   CHA2DS2-VASc Score = 3  The patient's score is based upon: CHF History: 0 HTN History: 1 Diabetes History: 0 Stroke History: 0 Vascular Disease History: 1 Age Score: 1 Gender Score: 0   {Confirm score is correct.  If not, click here to  update score.  REFRESH note.  :1}    ASSESSMENT AND PLAN: Paroxysmal Atrial Fibrillation (ICD10:  I48.0) The patient's CHA2DS2-VASc score is 3, indicating a 3.2% annual risk of stroke.   S/p ablation in 2011 (Dr Harwood Lingo) with repeat ablation with Dr Arlester Ladd on 08/29/22 and 12/01/23. Patient appears to be maintaining SR*** Continue Eliquis  5 mg BID with no missed doses for 3 months post ablation.  Continue Toprol  25 mg daily  Secondary Hypercoagulable State (ICD10:  D68.69){Click to add to Prob List or Visit Dx  :[id number]} The patient is at significant risk for stroke/thromboembolism based upon his CHA2DS2-VASc Score of 3.  Continue Apixaban  (Eliquis ). No bleeding issues.   HTN Stable on current  regimen ***  OSA  Encouraged nightly CPAP ***   Follow up ***   Myrtha Ates PA-C Afib Clinic [facility] [address] [place], [place] [address] [phone]

## 2023-12-29 NOTE — Progress Notes (Signed)
 Primary Care Physician: Faustina Hood, MD Referring Physician: Dr. Josephus Nida Primary EP: Dr Arlester Ladd   [name] is a 68 y.o. male with a h/o remote afib having an ablation in 2011 by Dr. Harwood Lingo in [facility] area. He had done well until the first of October when he developed Covid and then he noted return of afib. This was documented on EKG when he saw Dr. Felipe Horton 04/28/22. He was started on eliquis  for stroke prevention and metoprolol  succinate. He is s/p repeat ablation with Dr Arlester Ladd on 08/29/22 and 12/01/23.  Patient returns for follow up for atrial fibrillation. He reports that he has done well since the ablation. He has had little twinges of palpitations lasting only 1-2 seconds. He did have a hematoma on his left groin site which has resolved. No bleeding issues on anticoagulation.   Today, he  denies symptoms of chest pain, shortness of breath, orthopnea, PND, lower extremity edema, dizziness, presyncope, syncope, bleeding, or neurologic sequela. The patient is tolerating medications without difficulties and is otherwise without complaint today.    Past Medical History:  Diagnosis Date   Anxiety    Depression    Dysrhythmia     a fib   Hypertension    OSA (obstructive sleep apnea)    mild with AHI 5.8/hr using oral appliance    Current Outpatient Medications  Medication Sig Dispense Refill   amLODipine  (NORVASC ) 10 MG tablet Take 10 mg by mouth daily.     apixaban  (ELIQUIS ) 5 MG TABS tablet TAKE 1 TABLET BY MOUTH TWICE  DAILY 180 tablet 1   ascorbic acid  (VITAMIN C ) 500 MG tablet Take 500 mg by mouth daily.     atorvastatin  (LIPITOR) 20 MG tablet Take 20 mg by mouth daily.     Coenzyme Q10 (CO Q-10) 200 MG CAPS Take 1 capsule by mouth in the morning.     desonide (DESOWEN) 0.05 % ointment Apply 1 Application topically 2 (two) times daily as needed (rash).     diphenhydramine -acetaminophen  (TYLENOL  PM) 25-500 MG TABS tablet Take 2 tablets by mouth at bedtime.      DULoxetine  (CYMBALTA ) 60 MG capsule Take 60 mg by mouth daily.     Ginkgo Biloba 120 MG CAPS Take 120 mg by mouth daily.     lisinopril  (ZESTRIL ) 20 MG tablet Take 1 tablet (20 mg total) by mouth daily. 90 tablet 3   Melatonin 10 MG CAPS Take 10 mg by mouth at bedtime.     METAMUCIL FIBER PO Take 1 Scoop by mouth daily.     metoprolol  succinate (TOPROL -XL) 25 MG 24 hr tablet Take 1 tablet (25 mg total) by mouth daily. 90 tablet 2   pregabalin  (LYRICA ) 50 MG capsule Take 100 mg by mouth 2 (two) times daily as needed (pain).     No current facility-administered medications for this encounter.    ROS- All systems are reviewed and negative except as per the HPI above  Physical Exam: Vitals:   12/29/23 1318  BP: 136/84  Pulse: 70  Weight: 111.5 kg  Height: 6' (1.829 m)    Wt Readings from Last 3 Encounters:  12/29/23 111.5 kg  12/01/23 111.1 kg  09/24/23 114 kg    GEN: Well nourished, well developed in no acute distress CARDIAC: Regular rate and rhythm, no murmurs, rubs, gallops RESPIRATORY:  Clear to auscultation without rales, wheezing or rhonchi  ABDOMEN: Soft, non-tender, non-distended EXTREMITIES:  No edema; No deformity    EKG today  demonstrates SR Vent. rate 70 BPM PR interval 190 ms QRS duration 90 ms QT/QTcB 412/444 ms   CHA2DS2-VASc Score = 3  The patient's score is based upon: CHF History: 0 HTN History: 1 Diabetes History: 0 Stroke History: 0 Vascular Disease History: 1 Age Score: 1 Gender Score: 0       ASSESSMENT AND PLAN: Paroxysmal Atrial Fibrillation (ICD10:  I48.0) The patient's CHA2DS2-VASc score is 3, indicating a 3.2% annual risk of stroke.   S/p ablation in 2011 (Dr Harwood Lingo) with repeat ablation with Dr Arlester Ladd on 08/29/22 and 12/01/23. Patient appears to be maintaining SR Continue Eliquis  5 mg BID with no missed doses for 3 months post ablation.  Continue Toprol  25 mg daily  Secondary Hypercoagulable State (ICD10:  D68.69) The patient  is at significant risk for stroke/thromboembolism based upon his CHA2DS2-VASc Score of 3.  Continue Apixaban  (Eliquis ). No bleeding issues.   HTN Stable on current regimen  OSA  Encouraged nightly CPAP   Follow up with Mertha Abrahams as scheduled.    Myrtha Ates PA-C Afib Clinic [facility] [address] [place], [place] [address] [phone]

## 2024-01-20 ENCOUNTER — Telehealth: Payer: Self-pay | Admitting: *Deleted

## 2024-01-20 DIAGNOSIS — G4733 Obstructive sleep apnea (adult) (pediatric): Secondary | ICD-10-CM

## 2024-01-20 DIAGNOSIS — I480 Paroxysmal atrial fibrillation: Secondary | ICD-10-CM

## 2024-01-20 NOTE — Telephone Encounter (Signed)
 Per Dr Shlomo, Please order [name] HST and do while using oral device.  [name] order placed and patient notified.   Ordering provider: WILBERT SHLOMO Associated diagnoses: G47.33 WatchPAT PA obtained on 01/20/2024 by Joshua Dalton Seip, CMA. Authorization: No; tracking ID NO PA REQ Patient notified of PIN (1234) on 01/20/2024 via Notification Method: phone.

## 2024-01-21 NOTE — Telephone Encounter (Signed)
 Prior Authorization/Notification is not required for the requested service(s). Decision ID #: [id number]  Patient to pick up Itamar on Friday 01/22/24.

## 2024-01-22 ENCOUNTER — Telehealth: Payer: Self-pay

## 2024-01-22 NOTE — Telephone Encounter (Signed)
 Patient came in to office and obtained [facility] Sleep Study. Patient education given. All questions were answered and patient verbalized understanding. No PA Required, PIN given in Office today.  Patient agreement reviewed and signed on 01/22/2024.  WatchPAT issued to patient on 01/22/2024 by Connell JAYSON Boers, LPN. Patient aware to not open the WatchPAT box until contacted with the activation PIN. Patient profile initialized in CloudPAT on 01/22/2024 by Connell Boers, LPN. Device serial number: [id number]

## 2024-01-24 ENCOUNTER — Encounter (INDEPENDENT_AMBULATORY_CARE_PROVIDER_SITE_OTHER): Admitting: Cardiology

## 2024-01-24 DIAGNOSIS — G4733 Obstructive sleep apnea (adult) (pediatric): Secondary | ICD-10-CM | POA: Diagnosis not present

## 2024-01-25 ENCOUNTER — Ambulatory Visit: Attending: Cardiology

## 2024-01-25 DIAGNOSIS — G4733 Obstructive sleep apnea (adult) (pediatric): Secondary | ICD-10-CM

## 2024-01-25 DIAGNOSIS — I480 Paroxysmal atrial fibrillation: Secondary | ICD-10-CM

## 2024-01-25 NOTE — Procedures (Signed)
   SLEEP STUDY REPORT Patient Information Study Date: 01/24/2024 Patient Name: [name] Patient ID: [id number] Birth Date: 05/09/1956 Age: 68 Gender: Male BMI: 32.0 (W=236 lb, H=6' 0'') Stopbang: 7 Referring Physician: Eulas Furbish, MD  TEST DESCRIPTION: Home sleep apnea testing was completed using the WatchPat, a Type 1 device, utilizing peripheral arterial tonometry (PAT), chest movement, actigraphy, pulse oximetry, pulse rate, body position and snore. AHI was calculated with apnea and hypopnea using valid sleep time as the denominator. RDI includes apneas, hypopneas, and RERAs. The data acquired and the scoring of sleep and all associated events were performed in accordance with the recommended standards and specifications as outlined in the AASM Manual for the Scoring of Sleep and Associated Events 2.2.0 (2015).  FINDINGS:  1. Mild Obstructive Sleep Apnea with AHI 11.8/hr.  2. No Central Sleep Apnea with pAHIc 2.1/hr.  3. Oxygen desaturations as low as 81%.  4. Moderate snoring was present. O2 sats were < 88% for 0.3 min.  5. Total sleep time was 8 hrs and 26 min.  6. 20.4% of total sleep time was spent in REM sleep.  7. Shortened sleep onset latency at 6 min.  8. Shortened REM sleep onset latency at 49 min.  9. Total awakenings were 29. 10. Arrhythmia detection: Suggestive of possible brief atrial fibrillation lasting 46 seconds. This is not diagnostic and further testing with outpatient telemetry monitoring is recommended.  DIAGNOSIS: Mild Obstructive Sleep Apnea (G47.33) Possible Atrial Fibrillation  RECOMMENDATIONS: 1. Clinical correlation of these findings is necessary. The decision to treat obstructive sleep apnea (OSA) is usually based on the presence of apnea symptoms or the presence of associated medical conditions such as Hypertension, Congestive Heart Failure, Atrial Fibrillation or Obesity. The most common symptoms of OSA are snoring, gasping for breath  while sleeping, daytime sleepiness and fatigue. 2. Initiating apnea therapy is recommended given the presence of symptoms and/or associated conditions. Recommend proceeding with one of the following:  a. Auto-CPAP therapy with a pressure range of 5-20cm H2O.  b. An oral appliance (OA) that can be obtained from certain dentists with expertise in sleep medicine. These are primarily of use in non-obese patients with mild and moderate disease.  c. An ENT consultation which may be useful to look for specific causes of obstruction and possible treatment options.  d. If patient is intolerant to PAP therapy, consider referral to ENT for evaluation for hypoglossal nerve stimulator. 3. Close follow-up is necessary to ensure success with CPAP or oral appliance therapy for maximum benefit . 4. A follow-up oximetry study on CPAP is recommended to assess the adequacy of therapy and determine the need for supplemental oxygen or the potential need for Bi-level therapy. An arterial blood gas to determine the adequacy of baseline ventilation and oxygenation should also be considered. 5. Healthy sleep recommendations include: adequate nightly sleep (normal 7-9 hrs/night), avoidance of caffeine after noon and alcohol near bedtime, and maintaining a sleep environment that is cool, dark and quiet. 6. Weight loss for overweight patients is recommended. Even modest amounts of weight loss can significantly improve the severity of sleep apnea. 7. Snoring recommendations include: weight loss where appropriate, side sleeping, and avoidance of alcohol before bed. 8. Operation of motor vehicle should be avoided when sleepy.  Signature: Wilbert Bihari, MD; [facility]; Diplomat, American Board of Sleep Medicine Electronically Signed: 01/25/2024 8:49:40 PM

## 2024-01-26 ENCOUNTER — Telehealth: Payer: Self-pay | Admitting: *Deleted

## 2024-01-26 NOTE — Telephone Encounter (Signed)
-----   Message from Wilbert Bihari sent at 01/25/2024  8:51 PM EDT ----- Please let patient know that they have sleep apnea and recommend treating with CPAP.  Please order an auto CPAP from 4-15cm H2O with heated humidity and mask of choice.  Order overnight pulse ox on CPAP.  Followup with me in 6 weeks.

## 2024-01-26 NOTE — Telephone Encounter (Signed)
 Eric Wilbert SAUNDERS, MD  P Cv [facility] Sleep Studies Please cancel order for CPAP - this study was done on oral device - please forward results of sleep study to Dr. Micky   CPAP order canceled. Sleep study results faxed to Dr Micky.

## 2024-01-26 NOTE — Telephone Encounter (Signed)
 Please cancel order for CPAP - this study was done on oral device - please forward results of sleep study to Dr. Micky.

## 2024-02-25 ENCOUNTER — Ambulatory Visit: Attending: Physician Assistant | Admitting: Physician Assistant

## 2024-02-25 VITALS — BP 120/77 | HR 62 | Ht 72.0 in | Wt 241.0 lb

## 2024-02-25 DIAGNOSIS — I480 Paroxysmal atrial fibrillation: Secondary | ICD-10-CM

## 2024-02-25 DIAGNOSIS — I10 Essential (primary) hypertension: Secondary | ICD-10-CM | POA: Diagnosis not present

## 2024-02-25 DIAGNOSIS — D6869 Other thrombophilia: Secondary | ICD-10-CM | POA: Diagnosis not present

## 2024-02-25 DIAGNOSIS — G4733 Obstructive sleep apnea (adult) (pediatric): Secondary | ICD-10-CM

## 2024-02-25 NOTE — Patient Instructions (Addendum)
 Medication Instructions:   Your physician recommends that you continue on your current medications as directed. Please refer to the Current Medication list given to you today.   *If you need a refill on your cardiac medications before your next appointment, please call your pharmacy*    Lab Work: NONE ORDERED  TODAY     If you have labs (blood work) drawn today and your tests are completely normal, you will receive your results only by: MyChart Message (if you have MyChart) OR A paper copy in the mail If you have any lab test that is abnormal or we need to change your treatment, we will call you to review the results.   Testing/Procedures: NONE ORDERED  TODAY     Follow-Up: At [facility], you and your health needs are our priority.  As part of our continuing mission to provide you with exceptional heart care, our providers are all part of one team.  This team includes your primary Cardiologist (physician) and Advanced Practice Providers or APPs (Physician Assistants and Nurse Practitioners) who all work together to provide you with the care you need, when you need it.    Your next appointment:  6 month(s)Provider:  RENEE URSUY / MEALOR          We recommend signing up for the patient portal called MyChart.  Sign up information is provided on this After Visit Summary.  MyChart is used to connect with patients for Virtual Visits (Telemedicine).  Patients are able to view lab/test results, encounter notes, upcoming appointments, etc.  Non-urgent messages can be sent to your provider as well.   To learn more about what you can do with MyChart, go to [email or web address].   Other Instructions

## 2024-02-25 NOTE — Progress Notes (Signed)
  Cardiology Office Note:  .   Date:  02/25/2024  ID:  [name], DOB 1955/11/13, MRN [id number] PCP: Claudene Pellet, MD  [facility] Providers Cardiologist:  None Electrophysiologist:  Eulas FORBES Furbish, MD {  History of Present Illness: [name] is a 68 y.o. male w/PMHx of  HTN, coronary Ca Sleep apnea w/CPAP AFib   Saw Dr. Furbish 09/24/23, had a couple symptomatic episodes of rate controlled though AFib since his last ablation AFib > poss organizing into [facility] Discussed AAD vs repeat ablation >  planned to pursue ablation including posterior wall  EPS/ablation 12/01/23  Sw the AFib clinic 12/29/23, reported fleeting twinges of palpitations, did have L groin hematoma that resolved, no there procedural concerns  Today's visit is scheduled as his 90 day post ablation visit ROS:   He is doing great Initially felt little fleeting palpitations No symptoms of AFib No CP, palpitations or cardiac awareness No SOB, DOE No near syncope or syncope No bleeding or signs of bleeding  Asks about minimizing meds Seems the last add on was amlodipine  for persistently elevated BP > advised no changes   Arrhythmia/AAD hx AFib ablation 2011, Dr. Epifanio in [place] AFib ablation 08/29/22 AFib ablation 12/01/23  Studies Reviewed: SABRA    EKG done today and reviewed by myself:  SR 62bpm  12/01/23 EPS/ablation CONCLUSIONS: 1. Sinus rhythm upon presentation.   2. Successful ablation of all four pulmonary veins with pulsed field energy. 3. Successful ablation of the posterior wall of the left atrium with pulsed field energy 4. No inducible arrhythmias following ablation  5. No early apparent complications.  08/29/22: EPS/ablation CONCLUSIONS: 1. Successful PVI 2. Intracardiac echo reveals no effusion 4. No early apparent complications.   Risk Assessment/Calculations:    Physical Exam:   VS:  There were no vitals taken for this visit.   Wt Readings from Last 3  Encounters:  12/29/23 245 lb 12.8 oz (111.5 kg)  12/01/23 245 lb (111.1 kg)  09/24/23 251 lb 6.4 oz (114 kg)    GEN: Well nourished, well developed in no acute distress NECK: No JVD; No carotid bruits CARDIAC: RRR, no murmurs, rubs, gallops RESPIRATORY:  CTA b/l without rales, wheezing or rhonchi  ABDOMEN: Soft, non-tender, non-distended EXTREMITIES: No edema; No deformity   ASSESSMENT AND PLAN: .    paroxysmal AFib CHA2DS2Vasc is 2, on Eliquis , appropriately dosed no burden by symptoms  HTN Looks good  Secondary hypercoagulable state 2/2 AFib    Dispo: back in 63mo, sooner if needed  Signed, Charlies Macario Arthur, PA-C

## 2024-05-12 ENCOUNTER — Other Ambulatory Visit: Payer: Self-pay | Admitting: Cardiovascular Disease

## 2024-05-13 NOTE — Telephone Encounter (Signed)
 Prescription refill request for Eliquis  received. Indication:   A-Fib Last office visit:  02/25/2024 Scr:  1.00  last labs on 09/24/2023 Age:   68 yrs. Weight:    109.3 kg  Pt meet protocol and pt's medication was sent to pt's pharmacy for 6 mos.
